# Patient Record
Sex: Female | Born: 1980 | Race: White | Hispanic: No | Marital: Married | Smoking: Never smoker
Health system: Southern US, Community
[De-identification: ages and names within clinical notes are randomized; demographics above are authoritative.]

## PROBLEM LIST (undated history)

## (undated) DIAGNOSIS — IMO0002 Reserved for concepts with insufficient information to code with codable children: Secondary | ICD-10-CM

## (undated) DIAGNOSIS — R87629 Unspecified abnormal cytological findings in specimens from vagina: Secondary | ICD-10-CM

## (undated) DIAGNOSIS — A609 Anogenital herpesviral infection, unspecified: Secondary | ICD-10-CM

---

## 2005-11-20 ENCOUNTER — Other Ambulatory Visit: Admission: RE | Admit: 2005-11-20 | Discharge: 2005-11-20 | Payer: Self-pay | Admitting: Gynecology

## 2006-11-20 ENCOUNTER — Other Ambulatory Visit: Admission: RE | Admit: 2006-11-20 | Discharge: 2006-11-20 | Payer: Self-pay | Admitting: Gynecology

## 2007-05-01 ENCOUNTER — Other Ambulatory Visit: Admission: RE | Admit: 2007-05-01 | Discharge: 2007-05-01 | Payer: Self-pay | Admitting: Gynecology

## 2007-09-02 ENCOUNTER — Other Ambulatory Visit: Admission: RE | Admit: 2007-09-02 | Discharge: 2007-09-02 | Payer: Self-pay | Admitting: Obstetrics and Gynecology

## 2007-11-18 ENCOUNTER — Encounter: Admission: RE | Admit: 2007-11-18 | Discharge: 2007-11-18 | Payer: Self-pay | Admitting: Gastroenterology

## 2008-04-20 IMAGING — US US ABDOMEN COMPLETE
1 series · 14 of 25 positions shown · non-contrast
Comparison: none

CLINICAL DATA: Abdominal pain. 
 COMPLETE ABDOMINAL ULTRASOUND:
TECHNIQUE: Complete abdominal ultrasound examination was performed including evaluation of the liver, gallbladder, bile ducts, pancreas, kidneys, spleen, IVC, and abdominal aorta.  Pancreatic region is limited for assessment due to overlying gastrointestinal gas.

[Series 1: unknown · 0.33mm/px · 14 of 66 slices shown]
[im 1/66]
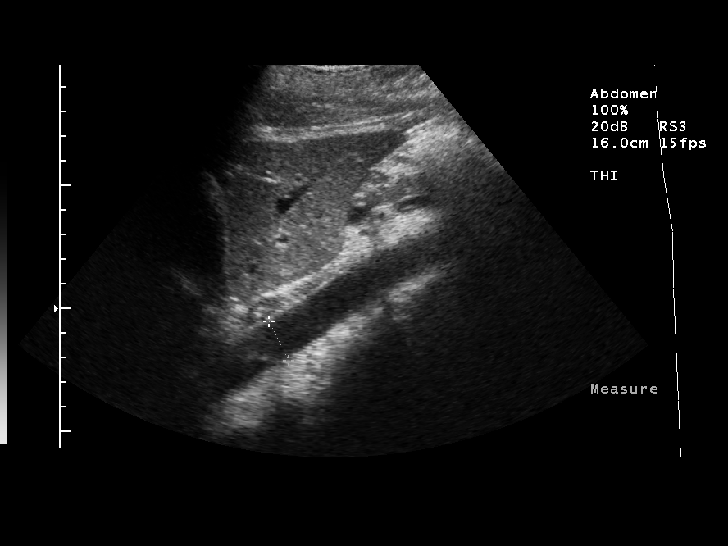
[im 6/66]
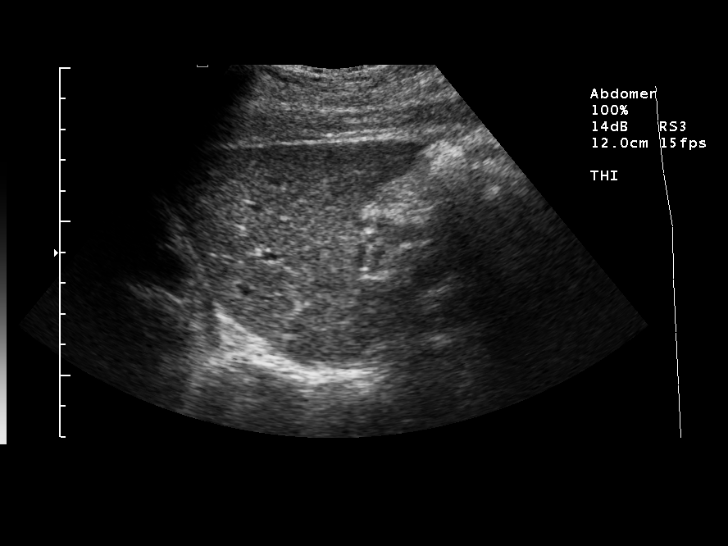
[im 11/66]
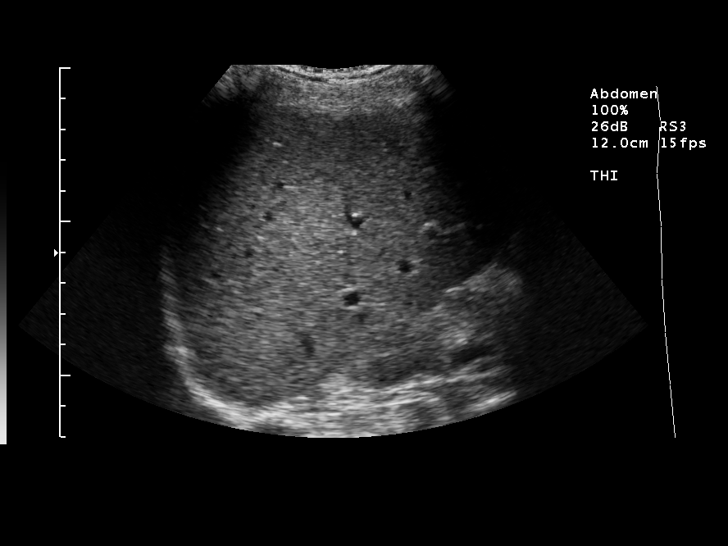
[im 17/66]
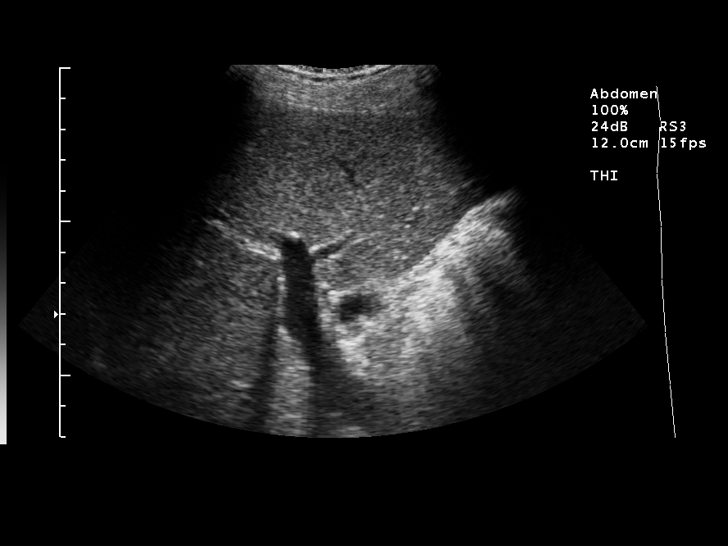
[im 22/66]
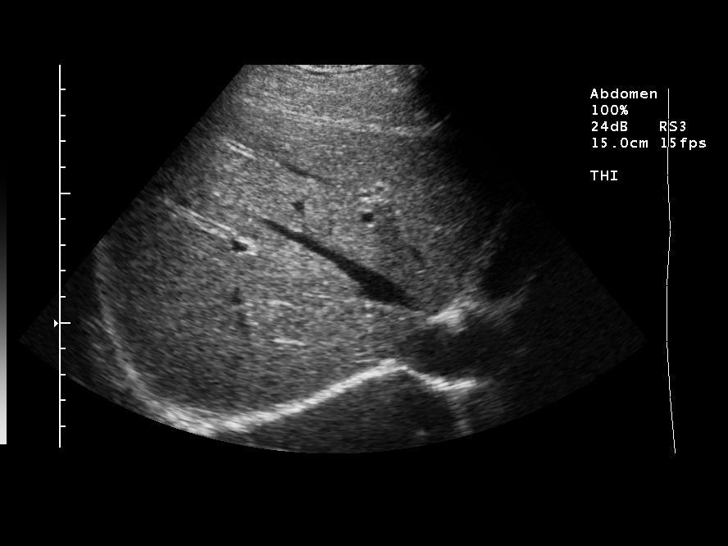
[im 25/66]
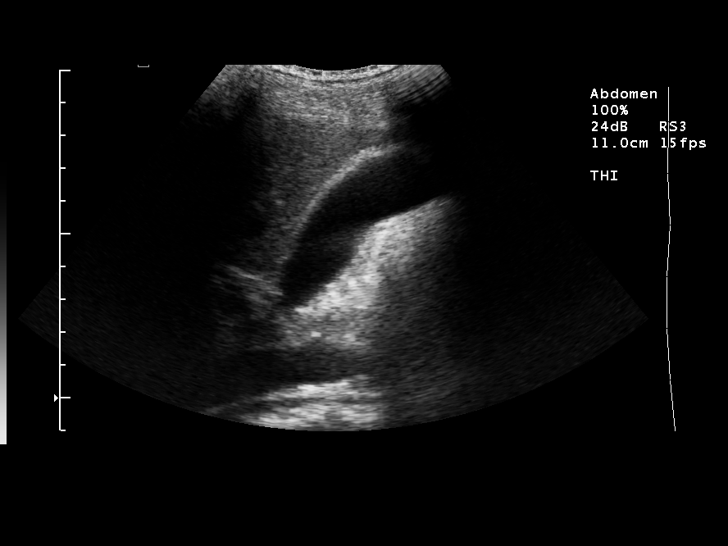
[im 30/66]
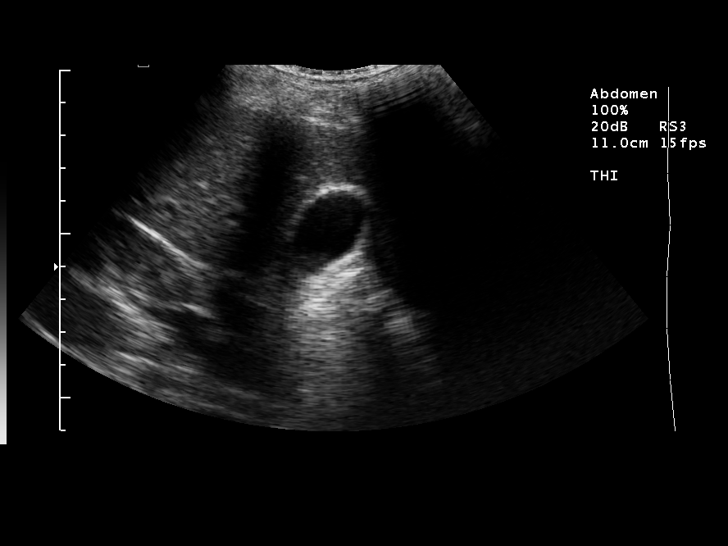
[im 36/66]
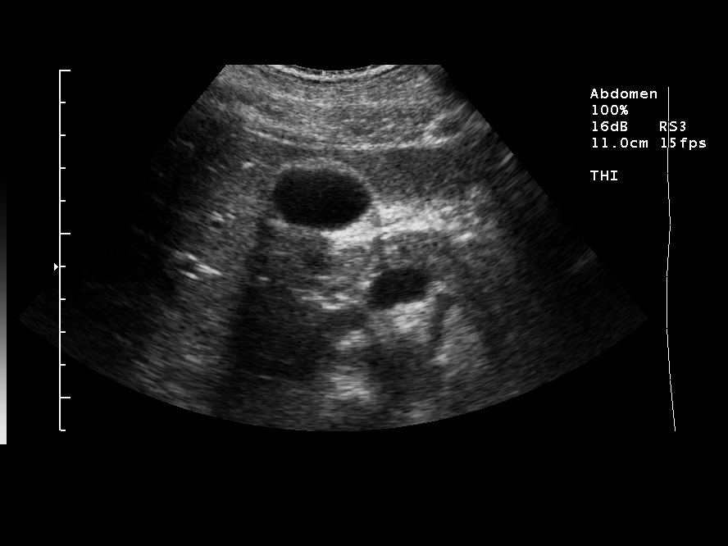
[im 41/66]
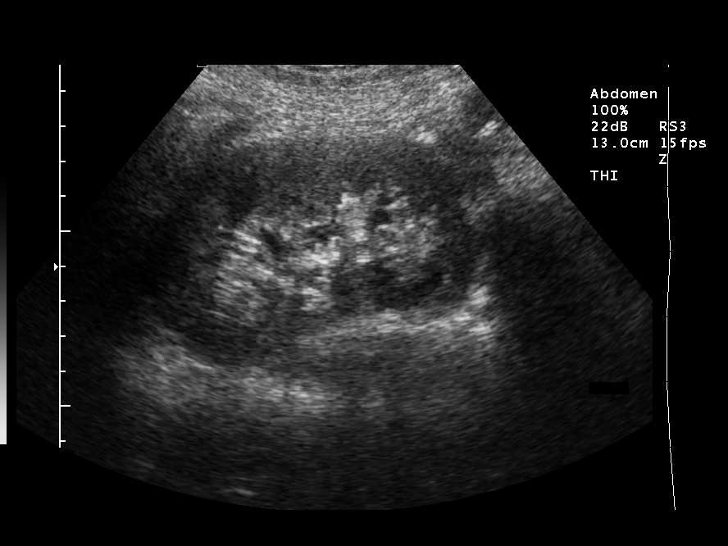
[im 44/66]
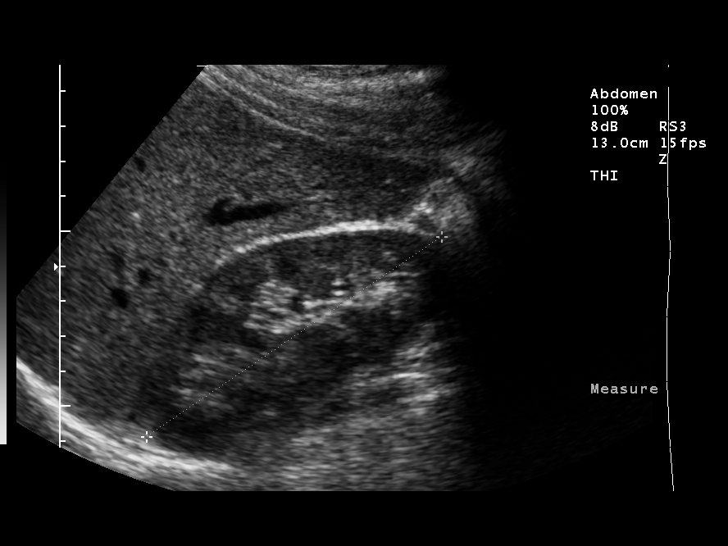
[im 49/66]
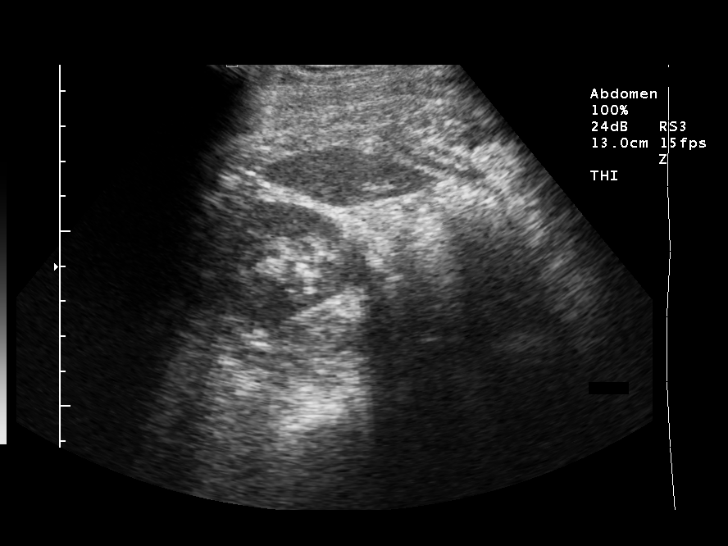
[im 55/66]
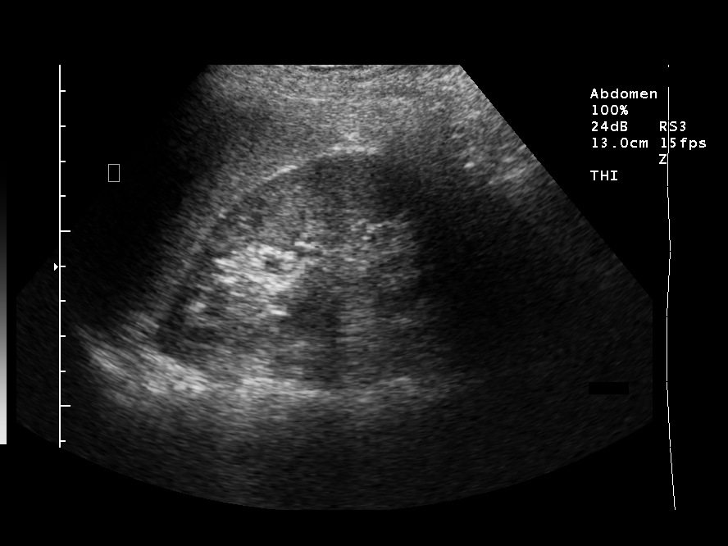
[im 60/66]
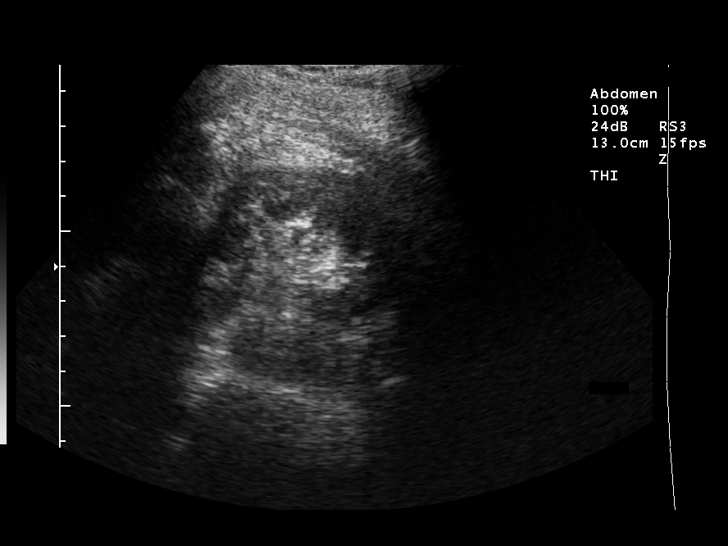
[im 66/66]
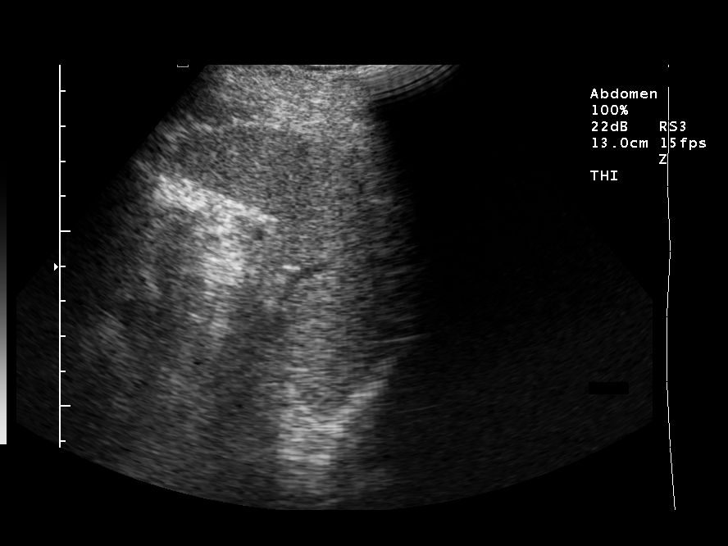

[14 of 25 positions shown; findings below may reference images not displayed]

FINDINGS: There is no evidence of gallstones or biliary ductal dilatation.  The liver is within normal limits in echogenicity, and no focal liver lesions are seen.  The visualized portions of the IVC and pancreas are unremarkable.
 There is no evidence of splenomegaly.  The kidneys are unremarkable, and there is no evidence of hydronephrosis.  The abdominal aorta is non-dilated.
 Gallbladder wall thickness 2mm, common bile duct diameter 2mm, spleen 8.4cm long, right kidney 10.1cm long, left kidney 10.5cm long, maximum abdominal aortic diameter 1.7cm.
IMPRESSION: 1.  Limited assessment of pancreas due to overlying gastrointestinal gas. 
 2.  Otherwise negative.

## 2008-08-02 ENCOUNTER — Other Ambulatory Visit: Admission: RE | Admit: 2008-08-02 | Discharge: 2008-08-02 | Payer: Self-pay | Admitting: Obstetrics and Gynecology

## 2008-09-13 ENCOUNTER — Other Ambulatory Visit: Admission: RE | Admit: 2008-09-13 | Discharge: 2008-09-13 | Payer: Self-pay | Admitting: Obstetrics and Gynecology

## 2010-01-18 ENCOUNTER — Other Ambulatory Visit: Admission: RE | Admit: 2010-01-18 | Discharge: 2010-01-18 | Payer: Self-pay | Admitting: Obstetrics and Gynecology

## 2011-01-24 ENCOUNTER — Other Ambulatory Visit (HOSPITAL_COMMUNITY)
Admission: RE | Admit: 2011-01-24 | Discharge: 2011-01-24 | Disposition: A | Discharge status: Home / Self Care | Payer: BC Managed Care – PPO | Source: Ambulatory Visit | Attending: Obstetrics and Gynecology | Admitting: Obstetrics and Gynecology

## 2011-01-24 ENCOUNTER — Other Ambulatory Visit: Payer: Self-pay | Admitting: Obstetrics and Gynecology

## 2011-01-24 DIAGNOSIS — Z113 Encounter for screening for infections with a predominantly sexual mode of transmission: Secondary | ICD-10-CM | POA: Insufficient documentation

## 2011-01-24 DIAGNOSIS — Z01419 Encounter for gynecological examination (general) (routine) without abnormal findings: Secondary | ICD-10-CM | POA: Insufficient documentation

## 2012-12-01 LAB — OB RESULTS CONSOLE GC/CHLAMYDIA
Chlamydia: NEGATIVE
Gonorrhea: NEGATIVE

## 2012-12-01 LAB — OB RESULTS CONSOLE HEPATITIS B SURFACE ANTIGEN: Hepatitis B Surface Ag: NEGATIVE

## 2012-12-01 LAB — OB RESULTS CONSOLE ABO/RH: RH Type: POSITIVE

## 2012-12-01 LAB — OB RESULTS CONSOLE RPR: RPR: NONREACTIVE

## 2013-07-13 ENCOUNTER — Encounter (HOSPITAL_COMMUNITY): Payer: Self-pay | Admitting: *Deleted

## 2013-07-13 ENCOUNTER — Encounter (HOSPITAL_COMMUNITY): Payer: Self-pay | Admitting: Anesthesiology

## 2013-07-13 ENCOUNTER — Inpatient Hospital Stay (HOSPITAL_COMMUNITY): Payer: BC Managed Care – PPO | Admitting: Anesthesiology

## 2013-07-13 ENCOUNTER — Inpatient Hospital Stay (HOSPITAL_COMMUNITY)
Admission: AD | Admit: 2013-07-13 | Discharge: 2013-07-16 | DRG: 371 | Disposition: A | Discharge status: Home / Self Care | Payer: BC Managed Care – PPO | Source: Ambulatory Visit | Attending: Obstetrics & Gynecology | Admitting: Obstetrics & Gynecology

## 2013-07-13 ENCOUNTER — Encounter (HOSPITAL_COMMUNITY)
Admission: AD | Disposition: A | Discharge status: Home / Self Care | Payer: Self-pay | Source: Ambulatory Visit | Attending: Obstetrics & Gynecology

## 2013-07-13 DIAGNOSIS — O480 Post-term pregnancy: Secondary | ICD-10-CM | POA: Diagnosis present

## 2013-07-13 DIAGNOSIS — O99892 Other specified diseases and conditions complicating childbirth: Secondary | ICD-10-CM | POA: Diagnosis present

## 2013-07-13 DIAGNOSIS — O3660X0 Maternal care for excessive fetal growth, unspecified trimester, not applicable or unspecified: Secondary | ICD-10-CM | POA: Diagnosis present

## 2013-07-13 DIAGNOSIS — O324XX0 Maternal care for high head at term, not applicable or unspecified: Secondary | ICD-10-CM | POA: Diagnosis present

## 2013-07-13 DIAGNOSIS — Z22330 Carrier of Group B streptococcus: Secondary | ICD-10-CM

## 2013-07-13 HISTORY — DX: Reserved for concepts with insufficient information to code with codable children: IMO0002

## 2013-07-13 LAB — CBC
HCT: 36.7 % (ref 36.0–46.0)
Hemoglobin: 13.2 g/dL (ref 12.0–15.0)
MCH: 31.4 pg (ref 26.0–34.0)
MCHC: 36 g/dL (ref 30.0–36.0)
MCV: 87.2 fL (ref 78.0–100.0)
RBC: 4.21 MIL/uL (ref 3.87–5.11)

## 2013-07-13 LAB — RPR: RPR Ser Ql: NONREACTIVE

## 2013-07-13 SURGERY — Surgical Case
Anesthesia: Epidural | Site: Abdomen | Wound class: Clean Contaminated

## 2013-07-13 MED ORDER — HYDROMORPHONE HCL PF 1 MG/ML IJ SOLN
INTRAMUSCULAR | Status: DC | PRN
  Administered 2013-07-13: 1 mg via INTRAVENOUS

## 2013-07-13 MED ORDER — BUPIVACAINE HCL (PF) 0.25 % IJ SOLN
INTRAMUSCULAR | Status: AC
  Filled 2013-07-13: qty 30

## 2013-07-13 MED ORDER — MORPHINE SULFATE (PF) 0.5 MG/ML IJ SOLN
INTRAMUSCULAR | Status: DC | PRN
  Administered 2013-07-13: 4 mg via EPIDURAL

## 2013-07-13 MED ORDER — BUPIVACAINE HCL (PF) 0.25 % IJ SOLN
INTRAMUSCULAR | Status: DC | PRN
  Administered 2013-07-13: 10 mL

## 2013-07-13 MED ORDER — LIDOCAINE HCL (PF) 1 % IJ SOLN
INTRAMUSCULAR | Status: DC | PRN
  Administered 2013-07-13 (×2): 4 mL

## 2013-07-13 MED ORDER — OXYTOCIN 40 UNITS IN LACTATED RINGERS INFUSION - SIMPLE MED
999.0000 mL/h | Freq: Once | INTRAVENOUS | Status: DC
  Filled 2013-07-13: qty 1000

## 2013-07-13 MED ORDER — DEXAMETHASONE SODIUM PHOSPHATE 10 MG/ML IJ SOLN
INTRAMUSCULAR | Status: DC | PRN
  Administered 2013-07-13: 10 mg via INTRAVENOUS

## 2013-07-13 MED ORDER — NALBUPHINE SYRINGE 5 MG/0.5 ML
5.0000 mg | INJECTION | INTRAMUSCULAR | Status: DC | PRN
  Administered 2013-07-14 (×2): 10 mg via INTRAVENOUS
  Filled 2013-07-13 (×3): qty 1

## 2013-07-13 MED ORDER — DEXAMETHASONE SODIUM PHOSPHATE 10 MG/ML IJ SOLN
INTRAMUSCULAR | Status: AC
  Filled 2013-07-13: qty 1

## 2013-07-13 MED ORDER — EPHEDRINE 5 MG/ML INJ
10.0000 mg | INTRAVENOUS | Status: DC | PRN
  Filled 2013-07-13: qty 4

## 2013-07-13 MED ORDER — KETOROLAC TROMETHAMINE 30 MG/ML IJ SOLN: 30.0000 mg | Freq: Four times a day (QID) | INTRAMUSCULAR | Status: AC | PRN

## 2013-07-13 MED ORDER — LIDOCAINE HCL (PF) 1 % IJ SOLN
30.0000 mL | INTRAMUSCULAR | Status: DC | PRN
  Filled 2013-07-13: qty 30

## 2013-07-13 MED ORDER — ONDANSETRON HCL 4 MG/2ML IJ SOLN
4.0000 mg | Freq: Four times a day (QID) | INTRAMUSCULAR | Status: DC | PRN
  Administered 2013-07-13: 4 mg via INTRAVENOUS
  Filled 2013-07-13: qty 2

## 2013-07-13 MED ORDER — KETOROLAC TROMETHAMINE 60 MG/2ML IM SOLN: 60.0000 mg | Freq: Once | INTRAMUSCULAR | Status: DC | PRN

## 2013-07-13 MED ORDER — NALBUPHINE SYRINGE 5 MG/0.5 ML
5.0000 mg | INJECTION | INTRAMUSCULAR | Status: DC | PRN
  Filled 2013-07-13: qty 1

## 2013-07-13 MED ORDER — SODIUM CHLORIDE 0.9 % IJ SOLN: 3.0000 mL | INTRAMUSCULAR | Status: DC | PRN

## 2013-07-13 MED ORDER — DIPHENHYDRAMINE HCL 50 MG/ML IJ SOLN: 12.5000 mg | INTRAMUSCULAR | Status: DC | PRN

## 2013-07-13 MED ORDER — MEPERIDINE HCL 25 MG/ML IJ SOLN: 6.2500 mg | INTRAMUSCULAR | Status: DC | PRN

## 2013-07-13 MED ORDER — FENTANYL CITRATE 0.05 MG/ML IJ SOLN
100.0000 ug | Freq: Once | INTRAMUSCULAR | Status: AC
  Administered 2013-07-13: 100 ug via EPIDURAL

## 2013-07-13 MED ORDER — BUPIVACAINE HCL (PF) 0.25 % IJ SOLN
INTRAMUSCULAR | Status: DC | PRN
  Administered 2013-07-13: 2.5 mL via EPIDURAL
  Administered 2013-07-13: 5 mL via EPIDURAL
  Administered 2013-07-13 (×2): 5 mL
  Administered 2013-07-13: 2.5 mL via EPIDURAL

## 2013-07-13 MED ORDER — NALOXONE HCL 0.4 MG/ML IJ SOLN: 0.4000 mg | INTRAMUSCULAR | Status: DC | PRN

## 2013-07-13 MED ORDER — SODIUM CHLORIDE 0.9 % IV SOLN
2.0000 g | Freq: Four times a day (QID) | INTRAVENOUS | Status: DC
  Administered 2013-07-13 (×2): 2 g via INTRAVENOUS
  Filled 2013-07-13 (×4): qty 2000

## 2013-07-13 MED ORDER — SCOPOLAMINE 1 MG/3DAYS TD PT72
MEDICATED_PATCH | TRANSDERMAL | Status: AC
  Administered 2013-07-13: 1.5 mg via TRANSDERMAL
  Filled 2013-07-13: qty 1

## 2013-07-13 MED ORDER — LACTATED RINGERS IV SOLN
500.0000 mL | Freq: Once | INTRAVENOUS | Status: AC
  Administered 2013-07-13: 500 mL via INTRAVENOUS

## 2013-07-13 MED ORDER — KETOROLAC TROMETHAMINE 30 MG/ML IJ SOLN
30.0000 mg | Freq: Four times a day (QID) | INTRAMUSCULAR | Status: AC | PRN
  Administered 2013-07-13 – 2013-07-14 (×2): 30 mg via INTRAVENOUS
  Filled 2013-07-13: qty 1

## 2013-07-13 MED ORDER — IBUPROFEN 600 MG PO TABS: 600.0000 mg | ORAL_TABLET | Freq: Four times a day (QID) | ORAL | Status: DC | PRN

## 2013-07-13 MED ORDER — FENTANYL CITRATE 0.05 MG/ML IJ SOLN
25.0000 ug | INTRAMUSCULAR | Status: DC | PRN
  Administered 2013-07-13 – 2013-07-14 (×2): 50 ug via INTRAVENOUS

## 2013-07-13 MED ORDER — PHENYLEPHRINE 40 MCG/ML (10ML) SYRINGE FOR IV PUSH (FOR BLOOD PRESSURE SUPPORT)
80.0000 ug | PREFILLED_SYRINGE | INTRAVENOUS | Status: DC | PRN
  Filled 2013-07-13: qty 5

## 2013-07-13 MED ORDER — FENTANYL CITRATE 0.05 MG/ML IJ SOLN
INTRAMUSCULAR | Status: AC
Start: 2013-07-13 — End: 2013-07-14
  Administered 2013-07-14: 50 ug via INTRAVENOUS
  Filled 2013-07-13: qty 2

## 2013-07-13 MED ORDER — FENTANYL CITRATE 0.05 MG/ML IJ SOLN
INTRAMUSCULAR | Status: AC
  Filled 2013-07-13: qty 2

## 2013-07-13 MED ORDER — OXYTOCIN 10 UNIT/ML IJ SOLN
INTRAMUSCULAR | Status: AC
  Filled 2013-07-13: qty 4

## 2013-07-13 MED ORDER — FENTANYL 2.5 MCG/ML BUPIVACAINE 1/10 % EPIDURAL INFUSION (WH - ANES)
14.0000 mL/h | INTRAMUSCULAR | Status: DC | PRN
  Administered 2013-07-13: 14 mL/h via EPIDURAL
  Filled 2013-07-13 (×2): qty 125

## 2013-07-13 MED ORDER — PHENYLEPHRINE 40 MCG/ML (10ML) SYRINGE FOR IV PUSH (FOR BLOOD PRESSURE SUPPORT): 80.0000 ug | PREFILLED_SYRINGE | INTRAVENOUS | Status: DC | PRN

## 2013-07-13 MED ORDER — MEPERIDINE HCL 25 MG/ML IJ SOLN
INTRAMUSCULAR | Status: AC
  Filled 2013-07-13: qty 1

## 2013-07-13 MED ORDER — KETOROLAC TROMETHAMINE 30 MG/ML IJ SOLN
INTRAMUSCULAR | Status: AC
  Administered 2013-07-13: 30 mg via INTRAVENOUS
  Filled 2013-07-13: qty 1

## 2013-07-13 MED ORDER — SODIUM CHLORIDE 0.9 % IV SOLN
2.0000 g | Freq: Once | INTRAVENOUS | Status: AC
  Administered 2013-07-13: 2 g via INTRAVENOUS
  Filled 2013-07-13: qty 2000

## 2013-07-13 MED ORDER — NALOXONE HCL 1 MG/ML IJ SOLN
1.0000 ug/kg/h | INTRAVENOUS | Status: DC | PRN
  Filled 2013-07-13: qty 2

## 2013-07-13 MED ORDER — OXYTOCIN 10 UNIT/ML IJ SOLN
40.0000 [IU] | INTRAVENOUS | Status: DC | PRN
  Administered 2013-07-13: 40 [IU] via INTRAVENOUS

## 2013-07-13 MED ORDER — BUPIVACAINE HCL (PF) 0.5 % IJ SOLN
INTRAMUSCULAR | Status: DC | PRN
  Administered 2013-07-13 (×2): 2.5 mL via EPIDURAL

## 2013-07-13 MED ORDER — DIPHENHYDRAMINE HCL 50 MG/ML IJ SOLN: 25.0000 mg | INTRAMUSCULAR | Status: DC | PRN

## 2013-07-13 MED ORDER — METOCLOPRAMIDE HCL 5 MG/ML IJ SOLN: 10.0000 mg | Freq: Three times a day (TID) | INTRAMUSCULAR | Status: DC | PRN

## 2013-07-13 MED ORDER — SCOPOLAMINE 1 MG/3DAYS TD PT72
1.0000 | MEDICATED_PATCH | Freq: Once | TRANSDERMAL | Status: DC
  Administered 2013-07-13: 1.5 mg via TRANSDERMAL

## 2013-07-13 MED ORDER — ONDANSETRON HCL 4 MG/2ML IJ SOLN
INTRAMUSCULAR | Status: AC
  Filled 2013-07-13: qty 2

## 2013-07-13 MED ORDER — MEPERIDINE HCL 25 MG/ML IJ SOLN
INTRAMUSCULAR | Status: DC | PRN
  Administered 2013-07-13 (×2): 12.5 mg via INTRAVENOUS

## 2013-07-13 MED ORDER — ONDANSETRON HCL 4 MG/2ML IJ SOLN
INTRAMUSCULAR | Status: DC | PRN
  Administered 2013-07-13: 4 mg via INTRAVENOUS

## 2013-07-13 MED ORDER — LIDOCAINE-EPINEPHRINE (PF) 2 %-1:200000 IJ SOLN
INTRAMUSCULAR | Status: AC
  Filled 2013-07-13: qty 20

## 2013-07-13 MED ORDER — ONDANSETRON HCL 4 MG/2ML IJ SOLN: 4.0000 mg | Freq: Three times a day (TID) | INTRAMUSCULAR | Status: DC | PRN

## 2013-07-13 MED ORDER — MORPHINE SULFATE 0.5 MG/ML IJ SOLN
INTRAMUSCULAR | Status: AC
  Filled 2013-07-13: qty 10

## 2013-07-13 MED ORDER — CITRIC ACID-SODIUM CITRATE 334-500 MG/5ML PO SOLN
30.0000 mL | ORAL | Status: DC | PRN
  Administered 2013-07-13: 30 mL via ORAL
  Filled 2013-07-13: qty 15

## 2013-07-13 MED ORDER — OXYTOCIN 40 UNITS IN LACTATED RINGERS INFUSION - SIMPLE MED: 62.5000 mL/h | INTRAVENOUS | Status: DC

## 2013-07-13 MED ORDER — OXYTOCIN 10 UNIT/ML IJ SOLN: 10.0000 [IU] | Freq: Once | INTRAMUSCULAR | Status: DC

## 2013-07-13 MED ORDER — FENTANYL CITRATE 0.05 MG/ML IJ SOLN
INTRAMUSCULAR | Status: AC
  Administered 2013-07-13: 50 ug via INTRAVENOUS
  Filled 2013-07-13: qty 2

## 2013-07-13 MED ORDER — FENTANYL CITRATE 0.05 MG/ML IJ SOLN
INTRAMUSCULAR | Status: DC | PRN
  Administered 2013-07-13: 100 ug via EPIDURAL

## 2013-07-13 MED ORDER — FENTANYL 2.5 MCG/ML BUPIVACAINE 1/10 % EPIDURAL INFUSION (WH - ANES)
INTRAMUSCULAR | Status: DC | PRN
  Administered 2013-07-13: 14 mL/h via EPIDURAL

## 2013-07-13 MED ORDER — SODIUM BICARBONATE 8.4 % IV SOLN
INTRAVENOUS | Status: AC
  Filled 2013-07-13: qty 50

## 2013-07-13 MED ORDER — SODIUM BICARBONATE 8.4 % IV SOLN
INTRAVENOUS | Status: DC | PRN
  Administered 2013-07-13 (×4): 5 mL via EPIDURAL

## 2013-07-13 MED ORDER — LACTATED RINGERS IV SOLN
INTRAVENOUS | Status: DC | PRN
  Administered 2013-07-13 (×3): via INTRAVENOUS

## 2013-07-13 MED ORDER — LACTATED RINGERS IV SOLN: 500.0000 mL | INTRAVENOUS | Status: DC | PRN

## 2013-07-13 MED ORDER — DIPHENHYDRAMINE HCL 25 MG PO CAPS
25.0000 mg | ORAL_CAPSULE | ORAL | Status: DC | PRN
  Filled 2013-07-13: qty 1

## 2013-07-13 SURGICAL SUPPLY — 28 items
CLAMP CORD UMBIL (MISCELLANEOUS) IMPLANT
CLOTH BEACON ORANGE TIMEOUT ST (SAFETY) ×2 IMPLANT
CONTAINER PREFILL 10% NBF 15ML (MISCELLANEOUS) IMPLANT
DRAPE LG THREE QUARTER DISP (DRAPES) ×2 IMPLANT
DRSG OPSITE POSTOP 4X10 (GAUZE/BANDAGES/DRESSINGS) ×2 IMPLANT
DURAPREP 26ML APPLICATOR (WOUND CARE) ×2 IMPLANT
ELECT REM PT RETURN 9FT ADLT (ELECTROSURGICAL) ×2
ELECTRODE REM PT RTRN 9FT ADLT (ELECTROSURGICAL) ×1 IMPLANT
EXTRACTOR VACUUM M CUP 4 TUBE (SUCTIONS) IMPLANT
GLOVE BIO SURGEON STRL SZ7.5 (GLOVE) ×2 IMPLANT
GOWN PREVENTION PLUS XLARGE (GOWN DISPOSABLE) ×2 IMPLANT
GOWN STRL REIN XL XLG (GOWN DISPOSABLE) ×4 IMPLANT
KIT ABG SYR 3ML LUER SLIP (SYRINGE) IMPLANT
NEEDLE HYPO 25X1 1.5 SAFETY (NEEDLE) ×2 IMPLANT
NEEDLE HYPO 25X5/8 SAFETYGLIDE (NEEDLE) IMPLANT
NS IRRIG 1000ML POUR BTL (IV SOLUTION) ×2 IMPLANT
PACK C SECTION WH (CUSTOM PROCEDURE TRAY) ×2 IMPLANT
STAPLER VISISTAT 35W (STAPLE) ×2 IMPLANT
SUT MNCRL 0 VIOLET CTX 36 (SUTURE) ×2 IMPLANT
SUT MON AB 2-0 CT1 27 (SUTURE) ×2 IMPLANT
SUT MON AB-0 CT1 36 (SUTURE) ×4 IMPLANT
SUT MONOCRYL 0 CTX 36 (SUTURE) ×2
SUT PLAIN 0 NONE (SUTURE) IMPLANT
SUT PLAIN 2 0 XLH (SUTURE) IMPLANT
SYR CONTROL 10ML LL (SYRINGE) ×2 IMPLANT
TOWEL OR 17X24 6PK STRL BLUE (TOWEL DISPOSABLE) ×2 IMPLANT
TRAY FOLEY CATH 14FR (SET/KITS/TRAYS/PACK) ×2 IMPLANT
WATER STERILE IRR 1000ML POUR (IV SOLUTION) ×2 IMPLANT

## 2013-07-13 NOTE — Transfer of Care (Signed)
Immediate Anesthesia Transfer of Care Note  Patient: Ariana Brown  Procedure(s) Performed: Procedure(s): Primary CESAREAN SECTION  of baby girl at 2256 APGAR 7/9 (N/A)  Patient Location: PACU  Anesthesia Type:Epidural  Level of Consciousness: awake  Airway & Oxygen Therapy: Patient Spontanous Breathing  Post-op Assessment: Report given to PACU RN and Post -op Vital signs reviewed and stable  Post vital signs: stable  Complications: No apparent anesthesia complications

## 2013-07-13 NOTE — Progress Notes (Signed)
Subjective:  Uncomfortable, feeling intense rectal pressure and shoulder/neck discomfort  Objective:   VS: Blood pressure 99/62, pulse 85, temperature 100.2 F (37.9 C), temperature source Axillary, resp. rate 18, height 5' 6.75" (1.695 m), weight 92.987 kg (205 lb). FHR : baseline 150 / variability moderate / accelerations + / variable decelerations Toco: contractions every 2-3 minutes  Cervix : 10/+1 Membranes: ruptured clear  Assessment:  Second stage labor FHR category II Maternal fever +GBS  Plan:  Minimal decent with pushing. Will dose epidural and labor down.  Dr. Billy Coast updated with plan of care, MD to assess     Ariana Brown, N MSN, CNM 07/13/2013, 10:03 PM

## 2013-07-13 NOTE — OR Nursing (Signed)
50 ml blood loss during fundal massage by DLWegner RN, cord blood x 2 to OR front desk 

## 2013-07-13 NOTE — Progress Notes (Signed)
Subjective:  Lying in bed, becoming more uncomfortable, ctx more frequent, having back pain, declines water immersion at this time  Objective:   VS: Blood pressure 85/52, pulse 87, temperature 98.1 F (36.7 C), temperature source Oral, resp. rate 18, height 5' 6.75" (1.695 m), weight 92.987 kg (205 lb). FHR : baseline 140 / variability moderate / accelerations + / no decelerations Toco: contractions every 3-4 minutes  Cervix : 6/100/-1 Membranes: intact  Assessment:  First stage labor-active GBS + FHR category I  Plan:  Expectant management, position changes and water immersion if desires, continue abx     Donette Larry, N MSN, CNM 07/13/2013, 1:19 PM

## 2013-07-13 NOTE — Anesthesia Procedure Notes (Addendum)
Epidural Patient location during procedure: OB Start time: 07/13/2013 3:00 PM  Staffing Anesthesiologist: Tapanga Ottaway A. Performed by: anesthesiologist   Preanesthetic Checklist Completed: patient identified, site marked, surgical consent, pre-op evaluation, timeout performed, IV checked, risks and benefits discussed and monitors and equipment checked  Epidural Patient position: sitting Prep: site prepped and draped and DuraPrep Patient monitoring: continuous pulse ox and blood pressure Approach: midline Injection technique: LOR air  Needle:  Needle type: Tuohy  Needle gauge: 17 G Needle length: 9 cm and 9 Needle insertion depth: 6 cm Catheter type: closed end flexible Catheter size: 19 Gauge Catheter at skin depth: 11 cm Test dose: negative and Other  Assessment Events: blood not aspirated, injection not painful, no injection resistance, negative IV test and no paresthesia  Additional Notes Patient identified. Risks and benefits discussed including failed block, incomplete  Pain control, post dural puncture headache, nerve damage, paralysis, blood pressure Changes, nausea, vomiting, reactions to medications-both toxic and allergic and post Partum back pain. All questions were answered. Patient expressed understanding and wished to proceed. Sterile technique was used throughout procedure. Epidural site was Dressed with sterile barrier dressing. No paresthesias, signs of intravascular injection Or signs of intrathecal spread were encountered.  Patient was more comfortable after the epidural was dosed. Please see RN's note for documentation of vital signs and FHR which are stable.   Epidural Patient location during procedure: OB Start time: 07/13/2013 5:20 PM  Staffing Anesthesiologist: Neah Sporrer A. Performed by: anesthesiologist   Preanesthetic Checklist Completed: patient identified, site marked, surgical consent, pre-op evaluation, timeout performed, IV  checked, risks and benefits discussed and monitors and equipment checked  Epidural Patient position: sitting Prep: site prepped and draped and DuraPrep Patient monitoring: continuous pulse ox and blood pressure Approach: midline Injection technique: LOR air  Needle:  Needle type: Tuohy  Needle gauge: 17 G Needle length: 9 cm and 9 Needle insertion depth: 5 cm cm Catheter type: closed end flexible Catheter size: 19 Gauge Catheter at skin depth: 10 cm Test dose: negative  Assessment Events: blood not aspirated, injection not painful, no injection resistance, negative IV test and no paresthesia  Additional Notes Patient identified. Risks and benefits discussed including failed block, incomplete  Pain control, post dural puncture headache, nerve damage, paralysis, blood pressure Changes, nausea, vomiting, reactions to medications-both toxic and allergic and post Partum back pain. All questions were answered. Patient expressed understanding and wished to proceed. Sterile technique was used throughout procedure. Epidural site was Dressed with sterile barrier dressing. No paresthesias, signs of intravascular injection Or signs of intrathecal spread were encountered.  Patient was more comfortable after the epidural was dosed. Please see RN's note for documentation of vital signs and FHR which are stable.

## 2013-07-13 NOTE — H&P (Signed)
OB ADMISSION/ HISTORY & PHYSICAL:  Admission Date: 07/13/2013  8:11 AM  Admit Diagnosis: 40.[redacted] wks gestation, active labor  Ariana Brown is a 32 y.o. female presenting for ctx starting at 8pm last night, occurring more frequently and now q 5 min since 0630. Planning water-birth.  Prenatal History: G1P0   EDC : 07/09/2013, by Karilyn Cota Date of Conception  Prenatal care at [facility]  Primary Ob Provider: Dr Billy Coast Prenatal course complicated by +GBS  Prenatal Labs: ABO, Rh: A (01/20 0000) POS Antibody: Negative (01/20 0000) Rubella: Immune (01/20 0000)  RPR: Nonreactive (01/20 0000)  HBsAg:   Neg HIV: Non-reactive (01/20 0000)  GBS: Positive (06/10 0000)  1 hr GTT : WNL  Medical / Surgical History :  Past medical history:  Past Medical History  Diagnosis Date  . Abnormal Pap smear      Past surgical history: History reviewed. No pertinent past surgical history.  Family History: History reviewed. No pertinent family history.   Social History:  reports that she has never smoked. She has never used smokeless tobacco. She reports that she does not drink alcohol or use illicit drugs.  Allergies: Hydrocodone   Current Medications at time of admission:  Prior to Admission medications   Medication Sig Start Date End Date Taking? Authorizing Provider  Prenatal Vit-Fe Fumarate-FA (PRENATAL MULTIVITAMIN) TABS tablet Take 1 tablet by mouth daily at 12 noon.   Yes Historical Provider, MD     Review of Systems: +ctx, no VB or LOF, +FM, no fever or chills   Physical Exam:  VS: Blood pressure 123/74, pulse 76, temperature 98.1 F (36.7 C), temperature source Oral, resp. rate 18, height 5' 6.75" (1.695 m), weight 92.987 kg (205 lb).  General: alert and oriented, appears uncomfortable w/ctx Heart: RRR Lungs: Clear lung fields Abdomen: Gravid, soft and non-tender, non-distended / uterus: EFW 7.5lbs Extremities: no edema  Genitalia / VE: Dilation: 5 Effacement (%):  90 Station: -2 Exam by:: Melanie, CNM Vtx  FHR: baseline rate 140 / variability moderate / accelerations present / no decelerations TOCO: q2-3  Assessment: 40.[redacted] weeks gestation First stage of labor FHR category I GBS carrier   Plan:  Admit, expectant management, GBS prophylaxis, water immersion for pain  Dr Seymour Bars notified of admission / plan of care   Donette Larry, Dorris Carnes MSN, CNM 07/13/2013, 11:45 AM

## 2013-07-13 NOTE — Progress Notes (Signed)
Subjective:  Comfortable with epidural, a little discomfort on left lower side  Objective:   VS: Blood pressure 115/70, pulse 67, temperature 98.2 F (36.8 C), temperature source Oral, resp. rate 18, height 5' 6.75" (1.695 m), weight 92.987 kg (205 lb). FHR : baseline 135 / variability moderate / accelerations + / no decelerations Toco: contractions every 3-5 minutes  Cervix : 6.5/100/-1 Membranes: AROM-clear, small  Assessment:  First stage labor, active, protracted FHR category I GBS+  Plan:  Active management, AROM augmentation, continue abx, epidural analgesia     Donette Larry, N MSN, CNM 07/13/2013, 3:48 PM

## 2013-07-13 NOTE — Progress Notes (Signed)
Ariana Brown Patient is a 32 y.o. G1P0 at [redacted]w[redacted]d by LMP admitted for active labor  Subjective: Pushing x 2 hrs with no descent  Objective: BP 99/62  Pulse 85  Temp(Src) 100.2 F (37.9 C) (Axillary)  Resp 18  Ht 5' 6.75" (1.695 m)  Wt 92.987 kg (205 lb)  BMI 32.37 kg/m2      FHT:  FHR: 155 bpm, variability: moderate,  accelerations:  Present,  decelerations:  Absent UC:   regular, every 2 minutes SVE:   Dilation: 10 Effacement (%): 100 Station: +1 Exam by:: M. Denyse Amass, cnm No change  Labs: Lab Results  Component Value Date   WBC 12.1* 07/13/2013   HGB 13.2 07/13/2013   HCT 36.7 07/13/2013   MCV 87.2 07/13/2013   PLT 257 07/13/2013    Assessment / Plan: Arrest of decent  Labor: above Preeclampsia:  labs stable Fetal Wellbeing:  Category I Pain Control:  Epidural I/D:  n/a Anticipated MOD:  proceed with cesarean section. consent done.   Romey Mathieson J 07/13/2013, 10:17 PM

## 2013-07-13 NOTE — Op Note (Signed)
Cesarean Section Procedure Note  Indications: failure to progress: arrest of descent  Pre-operative Diagnosis: 40 week 4 day pregnancy.  Post-operative Diagnosis: same  Surgeon: Lenoard Aden   Assistants: Juliene Pina, MD  Anesthesia: Epidural anesthesia and Local anesthesia 0.25.% bupivacaine  ASA Class: 2  Procedure Details  The patient was seen in the Holding Room. The risks, benefits, complications, treatment options, and expected outcomes were discussed with the patient.  The patient concurred with the proposed plan, giving informed consent. The risks of anesthesia, infection, bleeding and possible injury to other organs discussed. Injury to bowel, bladder, or ureter with possible need for repair discussed. Possible need for transfusion with secondary risks of hepatitis or HIV acquisition discussed. Post operative complications to include but not limited to DVT, PE and Pneumonia noted. The site of surgery properly noted/marked. The patient was taken to Operating Room # 9, identified as Samera Krukowski and the procedure verified as C-Section Delivery. A Time Out was held and the above information confirmed.  After induction of anesthesia, the patient was draped and prepped in the usual sterile manner. A Pfannenstiel incision was made and carried down through the subcutaneous tissue to the fascia. Fascial incision was made and extended transversely using Mayo scissors. The fascia was separated from the underlying rectus tissue superiorly and inferiorly. The peritoneum was identified and entered. Peritoneal incision was extended longitudinally. The utero-vesical peritoneal reflection was incised transversely and the bladder flap was bluntly freed from the lower uterine segment. A low transverse uterine incision(Kerr hysterotomy) was made. Delivered from OT presentation was a  female with Apgar scores of 9 at one minute and 9 at five minutes. Bulb suctioning gently performed. Neonatal team in  attendance.After the umbilical cord was clamped and cut cord blood was obtained for evaluation. The placenta was removed intact and appeared normal. The uterus was curetted with a dry lap pack. Good hemostasis was noted.The uterine outline, tubes and ovaries appeared normal. The uterine incision was closed with running locked sutures of 0 Monocryl x 2 layers. Hemostasis was observed. Lavage was carried out until clear.The parietal peritoneum was closed with a running 2-0 Monocryl suture. The fascia was then reapproximated with running sutures of 0 Monocryl. 2-0 plain to close [place] layer. The skin was reapproximated with staples.  Instrument, sponge, and needle counts were correct prior the abdominal closure and at the conclusion of the case.   Findings: As noted, nl posterior placenta, nl tubes and ovaries, asynclitic OT presentation  Estimated Blood Loss:  500         Drains: foley                 Specimens: placenta                 Complications:  None; patient tolerated the procedure well.         Disposition: PACU - hemodynamically stable.         Condition: stable  Attending Attestation: I performed the procedure.

## 2013-07-13 NOTE — Progress Notes (Signed)
Subjective:  Feeling rectal pressure. Comfortable with epidural.  Objective:   VS: Blood pressure 97/51, pulse 66, temperature 98.6 F (37 C), temperature source Oral, resp. rate 18, height 5' 6.75" (1.695 m), weight 92.987 kg (205 lb). FHR : baseline 120 / variability moderate / accelerations + / variable decelerations Toco: contractions every 2-3 minutes, moderate Cervix : 9.5/100/0-+1 Membranes: ruptured-clear  Assessment:  Active first stage labor FHR category II +GBS  Plan:  Minimal fetal decent. Repositioned in exaggerated sims.       Donette Larry, N MSN, CNM 07/13/2013, 6:59 PM

## 2013-07-13 NOTE — Progress Notes (Signed)
Subjective:  Breathing with ctx, was in tub but now out, immersion decreased pain. No change in intensity or frequency of ctx.  Objective:   VS: Blood pressure 123/74, pulse 76, temperature 98.1 F (36.7 C), temperature source Oral, resp. rate 18, height 5' 6.75" (1.695 m), weight 92.987 kg (205 lb). FHR : baseline 140 / variability moderate / accelerations present / no decelerations Toco: contractions every 3-4 minutes, moderate Cervix : deferred Membranes: intact  Assessment:  40.[redacted] wks gestation First stage labor FHR category I +GBS  Plan:  Expectant management, encouraged ambulation and position changes for promotion of labor.      Donette Larry, N MSN, CNM 07/13/2013, 12:07 PM

## 2013-07-13 NOTE — Progress Notes (Signed)
Subjective:  Comfortable after epidural replacement, some nausea and vomiting  Objective:   VS: Blood pressure 96/64, pulse 77, temperature 98.6 F (37 C), temperature source Oral, resp. rate 18, height 5' 6.75" (1.695 m), weight 92.987 kg (205 lb). FHR : baseline 130 / variability moderate / accelerations + / no decelerations Toco: contractions every 1-5 minutes Cervix : 8/100/0 Membranes: rupt/clear  Assessment:  First stage labor, active FHR category I +GBS  Plan:  Labor progressing well, anticipate SVD Continue epidural Continue abx Zofran prn    Ariana Brown, N MSN, CNM 07/13/2013, 5:47 PM

## 2013-07-13 NOTE — Anesthesia Preprocedure Evaluation (Addendum)
Anesthesia Evaluation  Patient identified by MRN, date of birth, ID band Patient awake    Reviewed: Allergy & Precautions, H&P , Patient's Chart, lab work & pertinent test results  Airway Mallampati: II TM Distance: >3 FB Neck ROM: full    Dental no notable dental hx. (+) Teeth Intact   Pulmonary neg pulmonary ROS,  breath sounds clear to auscultation  Pulmonary exam normal       Cardiovascular negative cardio ROS  Rhythm:regular Rate:Normal     Neuro/Psych negative neurological ROS  negative psych ROS   GI/Hepatic negative GI ROS, Neg liver ROS,   Endo/Other  negative endocrine ROSObesity  Renal/GU negative Renal ROS  negative genitourinary   Musculoskeletal   Abdominal Normal abdominal exam  (+)   Peds  Hematology negative hematology ROS (+)   Anesthesia Other Findings   Reproductive/Obstetrics (+) Pregnancy (arrest of descent --> C/S)                          Anesthesia Physical Anesthesia Plan  ASA: II and emergent  Anesthesia Plan: Epidural   Post-op Pain Management:    Induction:   Airway Management Planned:   Additional Equipment:   Intra-op Plan:   Post-operative Plan:   Informed Consent: I have reviewed the patients History and Physical, chart, labs and discussed the procedure including the risks, benefits and alternatives for the proposed anesthesia with the patient or authorized representative who has indicated his/her understanding and acceptance.     Plan Discussed with: Anesthesiologist, Surgeon and CRNA  Anesthesia Plan Comments:        Anesthesia Quick Evaluation

## 2013-07-14 ENCOUNTER — Encounter (HOSPITAL_COMMUNITY): Payer: Self-pay

## 2013-07-14 LAB — CBC
Hemoglobin: 12.2 g/dL (ref 12.0–15.0)
RBC: 4.01 MIL/uL (ref 3.87–5.11)
WBC: 22.9 10*3/uL — ABNORMAL HIGH (ref 4.0–10.5)

## 2013-07-14 MED ORDER — SENNOSIDES-DOCUSATE SODIUM 8.6-50 MG PO TABS
2.0000 | ORAL_TABLET | Freq: Every day | ORAL | Status: DC
  Administered 2013-07-14 – 2013-07-15 (×2): 2 via ORAL

## 2013-07-14 MED ORDER — OXYCODONE-ACETAMINOPHEN 5-325 MG PO TABS
1.0000 | ORAL_TABLET | ORAL | Status: DC | PRN
  Administered 2013-07-15 – 2013-07-16 (×5): 2 via ORAL
  Filled 2013-07-14 (×5): qty 2

## 2013-07-14 MED ORDER — CLINDAMYCIN PHOSPHATE 900 MG/50ML IV SOLN
900.0000 mg | Freq: Three times a day (TID) | INTRAVENOUS | Status: DC
  Administered 2013-07-14: 900 mg via INTRAVENOUS

## 2013-07-14 MED ORDER — TETANUS-DIPHTH-ACELL PERTUSSIS 5-2.5-18.5 LF-MCG/0.5 IM SUSP: 0.5000 mL | Freq: Once | INTRAMUSCULAR | Status: DC

## 2013-07-14 MED ORDER — SODIUM CHLORIDE 0.9 % IV SOLN
2.0000 g | Freq: Four times a day (QID) | INTRAVENOUS | Status: DC
  Administered 2013-07-14 – 2013-07-15 (×5): 2 g via INTRAVENOUS
  Filled 2013-07-14 (×6): qty 2000

## 2013-07-14 MED ORDER — LANOLIN HYDROUS EX OINT: 1.0000 "application " | TOPICAL_OINTMENT | CUTANEOUS | Status: DC | PRN

## 2013-07-14 MED ORDER — WITCH HAZEL-GLYCERIN EX PADS: 1.0000 "application " | MEDICATED_PAD | CUTANEOUS | Status: DC | PRN

## 2013-07-14 MED ORDER — LACTATED RINGERS IV SOLN
INTRAVENOUS | Status: DC
  Administered 2013-07-14: 06:00:00 via INTRAVENOUS

## 2013-07-14 MED ORDER — PRENATAL MULTIVITAMIN CH
1.0000 | ORAL_TABLET | Freq: Every day | ORAL | Status: DC
  Administered 2013-07-15: 1 via ORAL
  Filled 2013-07-14: qty 1

## 2013-07-14 MED ORDER — ONDANSETRON HCL 4 MG/2ML IJ SOLN: 4.0000 mg | INTRAMUSCULAR | Status: DC | PRN

## 2013-07-14 MED ORDER — SIMETHICONE 80 MG PO CHEW
80.0000 mg | CHEWABLE_TABLET | Freq: Three times a day (TID) | ORAL | Status: DC
  Administered 2013-07-14 – 2013-07-16 (×8): 80 mg via ORAL

## 2013-07-14 MED ORDER — OXYTOCIN 40 UNITS IN LACTATED RINGERS INFUSION - SIMPLE MED: 62.5000 mL/h | INTRAVENOUS | Status: AC

## 2013-07-14 MED ORDER — METHYLERGONOVINE MALEATE 0.2 MG PO TABS: 0.2000 mg | ORAL_TABLET | ORAL | Status: DC | PRN

## 2013-07-14 MED ORDER — IBUPROFEN 600 MG PO TABS
600.0000 mg | ORAL_TABLET | Freq: Four times a day (QID) | ORAL | Status: DC
  Administered 2013-07-14 – 2013-07-16 (×8): 600 mg via ORAL
  Filled 2013-07-14 (×8): qty 1

## 2013-07-14 MED ORDER — CLINDAMYCIN PHOSPHATE 900 MG/50ML IV SOLN
900.0000 mg | Freq: Once | INTRAVENOUS | Status: DC
  Filled 2013-07-14: qty 50

## 2013-07-14 MED ORDER — DIPHENHYDRAMINE HCL 25 MG PO CAPS: 25.0000 mg | ORAL_CAPSULE | Freq: Four times a day (QID) | ORAL | Status: DC | PRN

## 2013-07-14 MED ORDER — DIBUCAINE 1 % RE OINT: 1.0000 "application " | TOPICAL_OINTMENT | RECTAL | Status: DC | PRN

## 2013-07-14 MED ORDER — CLINDAMYCIN PHOSPHATE 900 MG/50ML IV SOLN
900.0000 mg | Freq: Three times a day (TID) | INTRAVENOUS | Status: DC
  Administered 2013-07-14 – 2013-07-15 (×4): 900 mg via INTRAVENOUS
  Filled 2013-07-14 (×5): qty 50

## 2013-07-14 MED ORDER — METHYLERGONOVINE MALEATE 0.2 MG/ML IJ SOLN: 0.2000 mg | INTRAMUSCULAR | Status: DC | PRN

## 2013-07-14 MED ORDER — MENTHOL 3 MG MT LOZG: 1.0000 | LOZENGE | OROMUCOSAL | Status: DC | PRN

## 2013-07-14 MED ORDER — SODIUM CHLORIDE 0.9 % IV SOLN
2.0000 g | Freq: Once | INTRAVENOUS | Status: AC
  Administered 2013-07-14: 2 g via INTRAVENOUS
  Filled 2013-07-14: qty 2000

## 2013-07-14 MED ORDER — SIMETHICONE 80 MG PO CHEW: 80.0000 mg | CHEWABLE_TABLET | ORAL | Status: DC | PRN

## 2013-07-14 MED ORDER — ZOLPIDEM TARTRATE 5 MG PO TABS: 5.0000 mg | ORAL_TABLET | Freq: Every evening | ORAL | Status: DC | PRN

## 2013-07-14 MED ORDER — ONDANSETRON HCL 4 MG PO TABS: 4.0000 mg | ORAL_TABLET | ORAL | Status: DC | PRN

## 2013-07-14 NOTE — Anesthesia Postprocedure Evaluation (Signed)
  Anesthesia Post-op Note  Anesthesia Post Note  Patient: Ariana Brown  Procedure(s) Performed: Procedure(s) (LRB): Primary CESAREAN SECTION  of baby girl at 2256 APGAR 7/9 (N/A)  Anesthesia type: Epidural  Patient location: PACU  Post pain: Pain level controlled  Post assessment: Post-op Vital signs reviewed  Last Vitals:  Filed Vitals:   07/14/13 0040  BP: 108/65  Pulse: 66  Temp: 37.3 C  Resp: 21    Post vital signs: stable  Level of consciousness: awake  Complications: No apparent anesthesia complications

## 2013-07-14 NOTE — Progress Notes (Signed)
Patient ID: Dalina Samara, female   DOB: 1981/06/16, 32 y.o.   MRN: [id number] Subjective: POD# 1 Information for the patient's newborn:  Elyn, Krogh Girl [place]qu[place] [[id number]]  female   Reports feeling very sleepy and sore Feeding: breast Patient reports tolerating PO.  Breast symptoms: none Pain controlled with ibuprofen (OTC) and narcotic analgesics including Percocet Denies HA/SOB/C/P/N/V/dizziness. Flatus: none. She reports vaginal bleeding as normal, without clots.  She is ambulating, urinating without difficult.     Objective:   VS:  Filed Vitals:   07/14/13 0600 07/14/13 0605 07/14/13 0610 07/14/13 0800  BP: 104/63 98/47 105/66 91/58  Pulse: 65 59 59 64  Temp: 97.9 F (36.6 C)   97.5 F (36.4 C)  TempSrc: Oral   Oral  Resp: 18   20  Height:      Weight:      SpO2: 97% 96% 96% 95%     Intake/Output Summary (Last 24 hours) at 07/14/13 1007 Last data filed at 07/14/13 0800  Gross per 24 hour  Intake   2600 ml  Output   3800 ml  Net  -1200 ml        Recent Labs  07/13/13 0900 07/14/13 0555  WBC 12.1* 22.9*  HGB 13.2 12.2  HCT 36.7 35.1*  PLT 257 251     Blood type: A/Positive/-- (01/20 0000)  Rubella: Immune (01/20 0000)     Physical Exam:  General: alert, cooperative and no distress CV: Regular rate and rhythm, S1S2 present or without murmur or extra heart sounds Resp: clear Abdomen: soft, non-tender, distended, hypoactive bowel sounds Incision: Tegaderm and honeycomb dressing in place with a marked section of bloody drainage Uterine Fundus: firm, below umbilicus, nontender Lochia: minimal Ext: extremities normal, atraumatic, no cyanosis, trace edema, No Homan's sign or signs of DVT      Assessment/Plan: 32 y.o.   POD# 1.  s/p Cesarean Delivery.  Indications: arrest of descent                Principal Problem:   Postpartum care following cesarean delivery (9/1) Active Problems:   Post term pregnancy  Doing well, stable.               Advance diet as  tolerated D/C foley  Ambulate Routine post-op care  Kenard Gower, MSN, CNM 07/14/2013, 10:07 AM

## 2013-07-14 NOTE — Anesthesia Postprocedure Evaluation (Signed)
  Anesthesia Post-op Note  Patient: Ariana Brown  Procedure(s) Performed: Procedure(s): Primary CESAREAN SECTION  of baby girl at 2256 APGAR 7/9 (N/A)  Patient Location: Mother/Baby  Anesthesia Type:Epidural  Level of Consciousness: awake, alert  and oriented  Airway and Oxygen Therapy: Patient Spontanous Breathing  Post-op Pain: mild  Post-op Assessment: Patient's Cardiovascular Status Stable, Respiratory Function Stable, No headache, No backache, No residual numbness and No residual motor weakness  Post-op Vital Signs: stable  Complications: No apparent anesthesia complications

## 2013-07-15 NOTE — Progress Notes (Signed)
POSTOPERATIVE DAY # 1 1/2 S/P cesarean section   S:         Reports feeling well             Tolerating po intake / no nausea / no vomiting / + flatus / no BM             Bleeding is light             Pain controlled with motrin and percocet             Up ad lib / ambulatory/ voiding QS  Newborn breast feeding  /female   O:  VS: BP 81/44  Pulse 61  Temp(Src) 98.1 F (36.7 C) (Oral)  Resp 16  Ht 5' 6.75" (1.695 m)  Wt 92.987 kg (205 lb)  BMI 32.37 kg/m2  SpO2 98%   LABS:               Recent Labs  07/13/13 0900 07/14/13 0555  WBC 12.1* 22.9*  HGB 13.2 12.2  PLT 257 251               Bloodtype: A/Positive/-- (01/20 0000)  Rubella: Immune (01/20 0000)                                             I&O: Intake/Output     09/02 0701 - 09/03 0700 09/03 0701 - 09/04 0700   P.O. 1860    I.V. (mL/kg) 875 (9.4)    Total Intake(mL/kg) 2735 (29.4)    Urine (mL/kg/hr) 1975 (0.9)    Blood     Total Output 1975     Net +760                       Physical Exam:             Alert and Oriented X3  Lungs: Clear and unlabored  Heart: regular rate and rhythm / no mumurs  Abdomen: soft, non-tender, non-distended active BS             Fundus: firm, non-tender, U-1             Dressing intact honeycomb              Incision:  approximated with staples / no erythema / no ecchymosis / no drainage  Perineum: no edema  Lochia: light  Extremities: trace edema, no calf pain or tenderness, negative Homans  A:        POD # 1 1/2 S/P cesarean section             Afebrile postop   P:        Routine postoperative care              DC antibiotic and IV              Advance activity - shower today             Plan DC in am   Marlinda Mike CNM, MSN, [facility] 07/15/2013, 9:55 AM

## 2013-07-16 MED ORDER — OXYCODONE-ACETAMINOPHEN 5-325 MG PO TABS: 1.0000 | ORAL_TABLET | ORAL | Status: DC | PRN

## 2013-07-16 MED ORDER — IBUPROFEN 600 MG PO TABS: 600.0000 mg | ORAL_TABLET | Freq: Four times a day (QID) | ORAL | Status: DC

## 2013-07-16 NOTE — Discharge Summary (Signed)
POSTOPERATIVE DISCHARGE SUMMARY:  Patient IDKatleen Brown MRN: [id number] DOB/AGE: 12-Apr-1981 32 y.o.  Admit date: 07/13/2013 Admission Diagnoses: 40.4 weeks / active labor onset / LGA  Discharge date:  07/16/2013 Discharge Diagnoses: POD 3 s/p CS  Prenatal history: [id number]   EDC : 07/09/2013, by Est. Date of Conception  Prenatal care at [facility]  Primary provider : Taavon Prenatal course complicated by LGA ( EFW 8-15) / + GBS status  Prenatal Labs: ABO, Rh: A/Positive/-- (01/20 0000)  Antibody: Negative (01/20 0000) Rubella: Immune (01/20 0000)  RPR: NON REACTIVE (09/01 0900)  HBsAg: Negative (01/20 0000)  HIV: Non-reactive (01/20 0000)  GTT : NL GBS: Positive (06/10 0000)   Medical / Surgical History :  Past medical history:  Past Medical History  Diagnosis Date  . Abnormal Pap smear   . Postpartum care following cesarean delivery (9/1) 07/14/2013    Past surgical history:  Past Surgical History  Procedure Laterality Date  . Cesarean section N/A 07/13/2013    Procedure: Primary CESAREAN SECTION  of baby girl at 2256 APGAR 7/9;  Surgeon: Lenoard Aden, MD;  Location: [facility];  Service: Obstetrics;  Laterality: N/A;    Family History: History reviewed. No pertinent family history.  Social History:  reports that she has never smoked. She has never used smokeless tobacco. She reports that she does not drink alcohol or use illicit drugs.  Allergies: Hydrocodone   Current Medications at time of admission:  Prior to Admission medications   Medication Sig Start Date End Date Taking? Authorizing Provider  Prenatal Vit-Fe Fumarate-FA (PRENATAL MULTIVITAMIN) TABS tablet Take 1 tablet by mouth daily at 12 noon.   Yes Historical Provider, MD   Intrapartum Course:  Admit for active labor with labor progression to complete dilation with protracted labor curve Pain management: water immersion initially then epidural Complicated by: protracted labor with slow  cervical progression / fever intrapartum (100.3) Interventions required: IV abx for 24 hour / cesarean delivery to arrest of descent  Procedures: Cesarean section delivery on 07/13/2013 with delivery of female newborn by Dr Billy Coast  See operative report for further details APGAR (1 MIN): 7   APGAR (5 MINS): 9    Postoperative / postpartum course:  Uncomplicated with discharge on POD 3   Physical Exam:   VSS: Temp:  [97.2 F (36.2 C)-98 F (36.7 C)] 97.2 F (36.2 C) (09/04 0700) Pulse Rate:  [59-67] 59 (09/04 0700) Resp:  [16-18] 18 (09/04 0700) BP: (96-97)/(62-68) 97/62 mmHg (09/04 0700)  LABS:  Recent Labs  07/14/13 0555  WBC 22.9*  HGB 12.2  PLT 251    General: pleasant / NAD / ambulatory Heart: RRR Lungs: clear  Abdomen: soft and non-tender / non-distended / active BS  Extremities: trace edema / negative Homans  Dressing: intact honeycomb dressing Incision:  approximated with staples / no erythema / no ecchymosis / no drainage  Discharge Instructions:  Discharged Condition: stable  Activity: pelvic rest and postoperative restrictions x 2   Diet: routine  Medications:    Medication List         ibuprofen 600 MG tablet  Commonly known as:  ADVIL,MOTRIN  Take 1 tablet (600 mg total) by mouth every 6 (six) hours.     oxyCODONE-acetaminophen 5-325 MG per tablet  Commonly known as:  PERCOCET/ROXICET  Take 1-2 tablets by mouth every 4 (four) hours as needed.     prenatal multivitamin Tabs tablet  Take 1 tablet by mouth daily at 12  noon.        Wound Care: keep clean and dry / remove honeycomb POD 4 at [facility] with staple removal Postpartum Instructions: [facility] discharge booklet - instructions reviewed  Discharge to: Home  Follow up :  [facility] in 1-3 days for interval visit with midwife for staple removal [facility] in 6 weeks for routine postpartum visit with Dr Billy Coast                Signed: Marlinda Mike CNM, MSN, [facility] 07/16/2013, 9:45 AM

## 2013-07-16 NOTE — Progress Notes (Signed)
POSTOPERATIVE DAY # 2-3 S/P CS   S:         Reports feeling well - ready to go home             Tolerating po intake / no nausea / no vomiting / + flatus / no BM             Bleeding is light             Pain controlled with motrin and percocet             Up ad lib / ambulatory/ voiding QS  Newborn breast feeding  / female   O:  VS: BP 97/62  Pulse 59  Temp(Src) 97.2 F (36.2 C) (Oral)  Resp 18  Ht 5' 6.75" (1.695 m)  Wt 92.987 kg (205 lb)  BMI 32.37 kg/m2  SpO2 98%   LABS:               Recent Labs  07/14/13 0555  WBC 22.9*  HGB 12.2  PLT 251               Bloodtype: A/Positive/-- (01/20 0000)  Rubella: Immune (01/20 0000)                                 Physical Exam:             Alert and Oriented X3  Lungs: Clear and unlabored  Heart: regular rate and rhythm / no mumurs  Abdomen: soft, non-tender, non-distended actiev BS             Fundus: firm, non-tender, U-1             Dressing intact honeycomb              Incision:  approximated with staples / no erythema / no ecchymosis / no drainage  Perineum: no edema  Lochia: light  Extremities: trace edema, no calf pain or tenderness, negative Homans  A:        POD # 2-3 S/P CS             P:        Routine postoperative care              DC home - staple removal at [facility] tomorrow    Marlinda Mike CNM, MSN, [facility] 07/16/2013, 9:37 AM

## 2014-09-13 ENCOUNTER — Encounter (HOSPITAL_COMMUNITY): Payer: Self-pay

## 2015-11-18 LAB — OB RESULTS CONSOLE ABO/RH: RH Type: POSITIVE

## 2015-11-18 LAB — OB RESULTS CONSOLE HIV ANTIBODY (ROUTINE TESTING): HIV: NONREACTIVE

## 2015-11-18 LAB — OB RESULTS CONSOLE GC/CHLAMYDIA
CHLAMYDIA, DNA PROBE: NEGATIVE
Gonorrhea: NEGATIVE

## 2015-11-18 LAB — OB RESULTS CONSOLE RPR: RPR: NONREACTIVE

## 2015-11-18 LAB — OB RESULTS CONSOLE RUBELLA ANTIBODY, IGM: Rubella: NON-IMMUNE/NOT IMMUNE

## 2015-11-18 LAB — OB RESULTS CONSOLE HEPATITIS B SURFACE ANTIGEN: HEP B S AG: NEGATIVE

## 2015-11-18 LAB — OB RESULTS CONSOLE ANTIBODY SCREEN: ANTIBODY SCREEN: NEGATIVE

## 2016-05-03 ENCOUNTER — Other Ambulatory Visit: Payer: Self-pay | Admitting: Obstetrics and Gynecology

## 2016-05-08 LAB — OB RESULTS CONSOLE GBS: STREP GROUP B AG: POSITIVE

## 2016-05-29 ENCOUNTER — Telehealth (HOSPITAL_COMMUNITY): Payer: Self-pay | Admitting: *Deleted

## 2016-05-29 ENCOUNTER — Encounter (HOSPITAL_COMMUNITY): Payer: Self-pay

## 2016-05-29 NOTE — Telephone Encounter (Signed)
Preadmission screen  

## 2016-05-30 ENCOUNTER — Encounter (HOSPITAL_COMMUNITY): Payer: Self-pay

## 2016-06-04 ENCOUNTER — Encounter (HOSPITAL_COMMUNITY)
Admission: RE | Admit: 2016-06-04 | Discharge: 2016-06-04 | Disposition: A | Discharge status: Home / Self Care | Payer: BLUE CROSS/BLUE SHIELD | Source: Ambulatory Visit | Attending: Obstetrics and Gynecology | Admitting: Obstetrics and Gynecology

## 2016-06-04 HISTORY — DX: Anogenital herpesviral infection, unspecified: A60.9

## 2016-06-04 HISTORY — DX: Unspecified abnormal cytological findings in specimens from vagina: R87.629

## 2016-06-04 LAB — CBC
HCT: 32.8 % — ABNORMAL LOW (ref 36.0–46.0)
HEMOGLOBIN: 11.2 g/dL — AB (ref 12.0–15.0)
MCH: 29.2 pg (ref 26.0–34.0)
MCHC: 34.1 g/dL (ref 30.0–36.0)
MCV: 85.4 fL (ref 78.0–100.0)
PLATELETS: 299 10*3/uL (ref 150–400)
RBC: 3.84 MIL/uL — AB (ref 3.87–5.11)
RDW: 13.5 % (ref 11.5–15.5)
WBC: 8.2 10*3/uL (ref 4.0–10.5)

## 2016-06-04 LAB — TYPE AND SCREEN
ABO/RH(D): A POS
ANTIBODY SCREEN: NEGATIVE

## 2016-06-04 LAB — ABO/RH: ABO/RH(D): A POS

## 2016-06-04 NOTE — Patient Instructions (Signed)
20 Audreyanna R Mullenix  06/04/2016   Your procedure is scheduled on:  06/05/2016  Enter through the Main Entrance of [facility] at 0945 AM.  Pick up the phone at the desk and dial [phone].   Call this number if you have problems the morning of surgery: [phone]   Remember:   Do not eat food:After Midnight.  Do not drink clear liquids: After Midnight.  Take these medicines the morning of surgery with A SIP OF WATER: none   Do not wear jewelry, make-up or nail polish.  Do not wear lotions, powders, or perfumes. You may wear deodorant.  Do not shave 48 hours prior to surgery.  Do not bring valuables to the hospital.  [facility] is not   responsible for any belongings or valuables brought to the hospital.  Contacts, dentures or bridgework may not be worn into surgery.  Leave suitcase in the car. After surgery it may be brought to your room.  For patients admitted to the hospital, checkout time is 11:00 AM the day of              discharge.   Patients discharged the day of surgery will not be allowed to drive             home.  Name and phone number of your driver: na  Special Instructions:   N/A   Please read over the following fact sheets that you were given:   Surgical Site Infection Prevention

## 2016-06-04 NOTE — H&P (Signed)
Ariana Brown is a 35 y.o. female presenting for rpt csection.. OB History    Gravida Para Term Preterm AB Living   2 1 1     1    SAB TAB Ectopic Multiple Live Births                 Past Medical History:  Diagnosis Date  . Abnormal Pap smear   . HSV (herpes simplex virus) anogenital infection   . Postpartum care following cesarean delivery (9/1) 07/14/2013  . Vaginal Pap smear, abnormal    Past Surgical History:  Procedure Laterality Date  . CESAREAN SECTION N/A 07/13/2013   Procedure: Primary CESAREAN SECTION  of baby girl at 2256 APGAR 7/9;  Surgeon: Lenoard Aden, MD;  Location: [facility];  Service: Obstetrics;  Laterality: N/A;   Family History: family history is not on file. Social History:  reports that she has never smoked. She has never used smokeless tobacco. She reports that she does not drink alcohol or use drugs.     Maternal Diabetes: No Genetic Screening: Normal Maternal Ultrasounds/Referrals: Normal Fetal Ultrasounds or other Referrals:  None Maternal Substance Abuse:  No Significant Maternal Medications:  None Significant Maternal Lab Results:  None Other Comments:  None  Review of Systems  Constitutional: Negative.    Maternal Medical History:  Fetal activity: Perceived fetal activity is normal.   Last perceived fetal movement was greater than 24 hours ago.    Prenatal complications: no prenatal complications Prenatal Complications - Diabetes: none.      unknown if currently breastfeeding. Maternal Exam:  Uterine Assessment: Contraction strength is mild.  Contraction frequency is rare.   Abdomen: Patient reports no abdominal tenderness. Surgical scars: low transverse.   Fetal presentation: vertex  Introitus: Normal vulva. Normal vagina.  Ferning test: not done.  Nitrazine test: not done. Amniotic fluid character: not assessed.  Pelvis: questionable for delivery.   Cervix: Cervix evaluated by digital exam.     Physical Exam  Nursing note and  vitals reviewed. Constitutional: She is oriented to person, place, and time. She appears well-developed and well-nourished.  HENT:  Head: Normocephalic.  Neck: Normal range of motion.  Cardiovascular: Normal rate and regular rhythm.   Respiratory: Effort normal and breath sounds normal.  GI: Soft. Bowel sounds are normal.  Genitourinary: Vagina normal and uterus normal.  Musculoskeletal: Normal range of motion.  Neurological: She is alert and oriented to person, place, and time. She has normal reflexes.  Skin: Skin is warm and dry.  Psychiatric: She has a normal mood and affect.    Prenatal labs: ABO, Rh: --/--/A POS (07/24 0177) Antibody: NEG (07/24 9390) Rubella: Nonimmune (01/06 0000) RPR: Nonreactive (01/06 0000)  HBsAg: Negative (01/06 0000)  HIV: Non-reactive (01/06 0000)  GBS: Positive (06/27 0000)   Assessment/Plan: Term IUP Rpt csection Consent done Risks vs benefits noted   Ariana Brown J 06/04/2016, 9:33 PM

## 2016-06-05 ENCOUNTER — Encounter (HOSPITAL_COMMUNITY)
Admission: RE | Disposition: A | Discharge status: Home / Self Care | Payer: Self-pay | Source: Ambulatory Visit | Attending: Obstetrics and Gynecology

## 2016-06-05 ENCOUNTER — Encounter (HOSPITAL_COMMUNITY): Payer: Self-pay | Admitting: *Deleted

## 2016-06-05 ENCOUNTER — Inpatient Hospital Stay (HOSPITAL_COMMUNITY): Payer: BLUE CROSS/BLUE SHIELD | Admitting: Certified Registered Nurse Anesthetist

## 2016-06-05 ENCOUNTER — Inpatient Hospital Stay (HOSPITAL_COMMUNITY)
Admission: RE | Admit: 2016-06-05 | Discharge: 2016-06-07 | DRG: 766 | Disposition: A | Discharge status: Home / Self Care | Payer: BLUE CROSS/BLUE SHIELD | Source: Ambulatory Visit | Attending: Obstetrics and Gynecology | Admitting: Obstetrics and Gynecology

## 2016-06-05 DIAGNOSIS — D649 Anemia, unspecified: Secondary | ICD-10-CM | POA: Diagnosis present

## 2016-06-05 DIAGNOSIS — O9902 Anemia complicating childbirth: Secondary | ICD-10-CM | POA: Diagnosis present

## 2016-06-05 DIAGNOSIS — Z3A39 39 weeks gestation of pregnancy: Secondary | ICD-10-CM

## 2016-06-05 DIAGNOSIS — O99824 Streptococcus B carrier state complicating childbirth: Secondary | ICD-10-CM | POA: Diagnosis present

## 2016-06-05 DIAGNOSIS — O34219 Maternal care for unspecified type scar from previous cesarean delivery: Secondary | ICD-10-CM | POA: Diagnosis present

## 2016-06-05 DIAGNOSIS — O34211 Maternal care for low transverse scar from previous cesarean delivery: Secondary | ICD-10-CM | POA: Diagnosis present

## 2016-06-05 DIAGNOSIS — O99019 Anemia complicating pregnancy, unspecified trimester: Secondary | ICD-10-CM | POA: Diagnosis present

## 2016-06-05 LAB — SYPHILIS: RPR W/REFLEX TO RPR TITER AND TREPONEMAL ANTIBODIES, TRADITIONAL SCREENING AND DIAGNOSIS ALGORITHM: RPR Ser Ql: NONREACTIVE

## 2016-06-05 SURGERY — Surgical Case
Anesthesia: Spinal

## 2016-06-05 MED ORDER — PROMETHAZINE HCL 25 MG/ML IJ SOLN: 6.2500 mg | INTRAMUSCULAR | Status: DC | PRN

## 2016-06-05 MED ORDER — SCOPOLAMINE 1 MG/3DAYS TD PT72
1.0000 | MEDICATED_PATCH | Freq: Once | TRANSDERMAL | Status: DC
  Filled 2016-06-05: qty 1

## 2016-06-05 MED ORDER — DIPHENHYDRAMINE HCL 50 MG/ML IJ SOLN: 12.5000 mg | INTRAMUSCULAR | Status: DC | PRN

## 2016-06-05 MED ORDER — SODIUM CHLORIDE 0.9% FLUSH: 3.0000 mL | INTRAVENOUS | Status: DC | PRN

## 2016-06-05 MED ORDER — NALOXONE HCL 2 MG/2ML IJ SOSY
1.0000 ug/kg/h | PREFILLED_SYRINGE | INTRAVENOUS | Status: DC | PRN
  Filled 2016-06-05: qty 2

## 2016-06-05 MED ORDER — KETOROLAC TROMETHAMINE 30 MG/ML IJ SOLN
INTRAMUSCULAR | Status: AC
  Filled 2016-06-05: qty 1

## 2016-06-05 MED ORDER — KETOROLAC TROMETHAMINE 30 MG/ML IJ SOLN: 30.0000 mg | Freq: Once | INTRAMUSCULAR | Status: DC

## 2016-06-05 MED ORDER — MORPHINE SULFATE-NACL 0.5-0.9 MG/ML-% IV SOSY
PREFILLED_SYRINGE | INTRAVENOUS | Status: AC
  Filled 2016-06-05: qty 1

## 2016-06-05 MED ORDER — IBUPROFEN 600 MG PO TABS: 600.0000 mg | ORAL_TABLET | Freq: Four times a day (QID) | ORAL | Status: DC | PRN

## 2016-06-05 MED ORDER — IBUPROFEN 600 MG PO TABS
600.0000 mg | ORAL_TABLET | Freq: Four times a day (QID) | ORAL | Status: DC
  Administered 2016-06-06 – 2016-06-07 (×7): 600 mg via ORAL
  Filled 2016-06-05 (×7): qty 1

## 2016-06-05 MED ORDER — SCOPOLAMINE 1 MG/3DAYS TD PT72
1.0000 | MEDICATED_PATCH | Freq: Once | TRANSDERMAL | Status: DC
  Administered 2016-06-05: 1.5 mg via TRANSDERMAL

## 2016-06-05 MED ORDER — SENNOSIDES-DOCUSATE SODIUM 8.6-50 MG PO TABS
2.0000 | ORAL_TABLET | ORAL | Status: DC
Start: 2016-06-06 — End: 2016-06-07
  Administered 2016-06-06 (×2): 2 via ORAL
  Filled 2016-06-05 (×2): qty 2

## 2016-06-05 MED ORDER — DIPHENHYDRAMINE HCL 25 MG PO CAPS
25.0000 mg | ORAL_CAPSULE | ORAL | Status: DC | PRN
  Administered 2016-06-05: 25 mg via ORAL
  Filled 2016-06-05 (×2): qty 1

## 2016-06-05 MED ORDER — ACETAMINOPHEN 500 MG PO TABS: 1000.0000 mg | ORAL_TABLET | Freq: Four times a day (QID) | ORAL | Status: DC

## 2016-06-05 MED ORDER — SIMETHICONE 80 MG PO CHEW
80.0000 mg | CHEWABLE_TABLET | Freq: Three times a day (TID) | ORAL | Status: DC
  Administered 2016-06-05 – 2016-06-07 (×5): 80 mg via ORAL
  Filled 2016-06-05 (×5): qty 1

## 2016-06-05 MED ORDER — KETOROLAC TROMETHAMINE 30 MG/ML IJ SOLN: 30.0000 mg | Freq: Four times a day (QID) | INTRAMUSCULAR | Status: AC | PRN

## 2016-06-05 MED ORDER — OXYTOCIN 10 UNIT/ML IJ SOLN
INTRAMUSCULAR | Status: AC
  Filled 2016-06-05: qty 4

## 2016-06-05 MED ORDER — ACETAMINOPHEN 500 MG PO TABS
1000.0000 mg | ORAL_TABLET | Freq: Four times a day (QID) | ORAL | Status: DC
  Administered 2016-06-05: 1000 mg via ORAL
  Filled 2016-06-05: qty 2

## 2016-06-05 MED ORDER — NALOXONE HCL 0.4 MG/ML IJ SOLN: 0.4000 mg | INTRAMUSCULAR | Status: DC | PRN

## 2016-06-05 MED ORDER — BUPIVACAINE IN DEXTROSE 0.75-8.25 % IT SOLN
INTRATHECAL | Status: DC | PRN
  Administered 2016-06-05: 1.4 mL via INTRATHECAL

## 2016-06-05 MED ORDER — PHENYLEPHRINE 8 MG IN D5W 100 ML (0.08MG/ML) PREMIX OPTIME
INJECTION | INTRAVENOUS | Status: AC
  Filled 2016-06-05: qty 100

## 2016-06-05 MED ORDER — MENTHOL 3 MG MT LOZG: 1.0000 | LOZENGE | OROMUCOSAL | Status: DC | PRN

## 2016-06-05 MED ORDER — ONDANSETRON HCL 4 MG/2ML IJ SOLN: 4.0000 mg | Freq: Three times a day (TID) | INTRAMUSCULAR | Status: DC | PRN

## 2016-06-05 MED ORDER — KETOROLAC TROMETHAMINE 30 MG/ML IJ SOLN
30.0000 mg | Freq: Four times a day (QID) | INTRAMUSCULAR | Status: DC | PRN
  Administered 2016-06-05: 30 mg via INTRAMUSCULAR

## 2016-06-05 MED ORDER — FENTANYL CITRATE (PF) 100 MCG/2ML IJ SOLN
INTRAMUSCULAR | Status: DC | PRN
  Administered 2016-06-05: 20 ug via INTRATHECAL

## 2016-06-05 MED ORDER — KETOROLAC TROMETHAMINE 30 MG/ML IJ SOLN: 30.0000 mg | Freq: Four times a day (QID) | INTRAMUSCULAR | Status: DC | PRN

## 2016-06-05 MED ORDER — HYDROMORPHONE HCL 1 MG/ML IJ SOLN
0.2500 mg | INTRAMUSCULAR | Status: DC | PRN
  Filled 2016-06-05: qty 1

## 2016-06-05 MED ORDER — DEXAMETHASONE SODIUM PHOSPHATE 4 MG/ML IJ SOLN
INTRAMUSCULAR | Status: AC
  Filled 2016-06-05: qty 1

## 2016-06-05 MED ORDER — HYDROMORPHONE HCL 1 MG/ML IJ SOLN
INTRAMUSCULAR | Status: AC
  Administered 2016-06-05: 0.5 mg via INTRAVENOUS
  Filled 2016-06-05: qty 1

## 2016-06-05 MED ORDER — SIMETHICONE 80 MG PO CHEW
80.0000 mg | CHEWABLE_TABLET | ORAL | Status: DC
  Administered 2016-06-06 (×2): 80 mg via ORAL
  Filled 2016-06-05 (×2): qty 1

## 2016-06-05 MED ORDER — ONDANSETRON HCL 4 MG/2ML IJ SOLN
INTRAMUSCULAR | Status: DC | PRN
  Administered 2016-06-05: 4 mg via INTRAVENOUS

## 2016-06-05 MED ORDER — CEFAZOLIN SODIUM-DEXTROSE 2-4 GM/100ML-% IV SOLN
2.0000 g | INTRAVENOUS | Status: AC
  Administered 2016-06-05: 2 g via INTRAVENOUS

## 2016-06-05 MED ORDER — DIPHENHYDRAMINE HCL 25 MG PO CAPS
25.0000 mg | ORAL_CAPSULE | ORAL | Status: DC | PRN
  Filled 2016-06-05: qty 1

## 2016-06-05 MED ORDER — HYDROMORPHONE HCL 1 MG/ML IJ SOLN
0.5000 mg | INTRAMUSCULAR | Status: DC | PRN
  Administered 2016-06-05 (×4): 0.5 mg via INTRAVENOUS
  Filled 2016-06-05: qty 1

## 2016-06-05 MED ORDER — MEPERIDINE HCL 25 MG/ML IJ SOLN: 6.2500 mg | INTRAMUSCULAR | Status: DC | PRN

## 2016-06-05 MED ORDER — MORPHINE SULFATE-NACL 0.5-0.9 MG/ML-% IV SOSY
PREFILLED_SYRINGE | INTRAVENOUS | Status: DC | PRN
  Administered 2016-06-05: .2 mg via INTRATHECAL

## 2016-06-05 MED ORDER — WITCH HAZEL-GLYCERIN EX PADS: 1.0000 "application " | MEDICATED_PAD | CUTANEOUS | Status: DC | PRN

## 2016-06-05 MED ORDER — SCOPOLAMINE 1 MG/3DAYS TD PT72
MEDICATED_PATCH | TRANSDERMAL | Status: AC
  Filled 2016-06-05: qty 1

## 2016-06-05 MED ORDER — TETANUS-DIPHTH-ACELL PERTUSSIS 5-2.5-18.5 LF-MCG/0.5 IM SUSP: 0.5000 mL | Freq: Once | INTRAMUSCULAR | Status: DC

## 2016-06-05 MED ORDER — PHENYLEPHRINE 8 MG IN D5W 100 ML (0.08MG/ML) PREMIX OPTIME
INJECTION | INTRAVENOUS | Status: DC | PRN
Start: 2016-06-05 — End: 2016-06-05
  Administered 2016-06-05: 60 ug/min via INTRAVENOUS

## 2016-06-05 MED ORDER — NALOXONE HCL 2 MG/2ML IJ SOSY
1.0000 ug/kg/h | PREFILLED_SYRINGE | INTRAVENOUS | Status: DC | PRN
  Filled 2016-06-05 (×7): qty 2

## 2016-06-05 MED ORDER — BUPIVACAINE HCL (PF) 0.25 % IJ SOLN
INTRAMUSCULAR | Status: AC
  Filled 2016-06-05: qty 10

## 2016-06-05 MED ORDER — ONDANSETRON HCL 4 MG/2ML IJ SOLN
INTRAMUSCULAR | Status: AC
  Filled 2016-06-05: qty 2

## 2016-06-05 MED ORDER — SCOPOLAMINE 1 MG/3DAYS TD PT72: 1.0000 | MEDICATED_PATCH | Freq: Once | TRANSDERMAL | Status: DC

## 2016-06-05 MED ORDER — FENTANYL CITRATE (PF) 100 MCG/2ML IJ SOLN: 25.0000 ug | INTRAMUSCULAR | Status: DC | PRN

## 2016-06-05 MED ORDER — OXYTOCIN 10 UNIT/ML IJ SOLN
INTRAVENOUS | Status: DC | PRN
  Administered 2016-06-05: 40 [IU] via INTRAVENOUS

## 2016-06-05 MED ORDER — ZOLPIDEM TARTRATE 5 MG PO TABS
5.0000 mg | ORAL_TABLET | Freq: Every evening | ORAL | Status: DC | PRN
Start: 2016-06-05 — End: 2016-06-06

## 2016-06-05 MED ORDER — NALBUPHINE HCL 10 MG/ML IJ SOLN: 5.0000 mg | Freq: Once | INTRAMUSCULAR | Status: DC | PRN

## 2016-06-05 MED ORDER — DIPHENHYDRAMINE HCL 50 MG/ML IJ SOLN
INTRAMUSCULAR | Status: AC
  Filled 2016-06-05: qty 1

## 2016-06-05 MED ORDER — DIBUCAINE 1 % RE OINT: 1.0000 "application " | TOPICAL_OINTMENT | RECTAL | Status: DC | PRN

## 2016-06-05 MED ORDER — METHYLERGONOVINE MALEATE 0.2 MG/ML IJ SOLN: 0.2000 mg | INTRAMUSCULAR | Status: DC | PRN

## 2016-06-05 MED ORDER — OXYTOCIN 40 UNITS IN LACTATED RINGERS INFUSION - SIMPLE MED: 2.5000 [IU]/h | INTRAVENOUS | Status: AC

## 2016-06-05 MED ORDER — DEXAMETHASONE SODIUM PHOSPHATE 4 MG/ML IJ SOLN
INTRAMUSCULAR | Status: DC | PRN
  Administered 2016-06-05: 4 mg via INTRAVENOUS

## 2016-06-05 MED ORDER — NALBUPHINE HCL 10 MG/ML IJ SOLN: 5.0000 mg | INTRAMUSCULAR | Status: DC | PRN

## 2016-06-05 MED ORDER — FENTANYL CITRATE (PF) 100 MCG/2ML IJ SOLN
INTRAMUSCULAR | Status: AC
  Filled 2016-06-05: qty 2

## 2016-06-05 MED ORDER — OXYCODONE-ACETAMINOPHEN 5-325 MG PO TABS: 2.0000 | ORAL_TABLET | ORAL | Status: DC | PRN

## 2016-06-05 MED ORDER — METHYLERGONOVINE MALEATE 0.2 MG PO TABS: 0.2000 mg | ORAL_TABLET | ORAL | Status: DC | PRN

## 2016-06-05 MED ORDER — DIPHENHYDRAMINE HCL 50 MG/ML IJ SOLN
12.5000 mg | INTRAMUSCULAR | Status: DC | PRN
  Administered 2016-06-05: 12.5 mg via INTRAVENOUS

## 2016-06-05 MED ORDER — DIPHENHYDRAMINE HCL 25 MG PO CAPS: 25.0000 mg | ORAL_CAPSULE | ORAL | Status: DC | PRN

## 2016-06-05 MED ORDER — COCONUT OIL OIL: 1.0000 "application " | TOPICAL_OIL | Status: DC | PRN

## 2016-06-05 MED ORDER — LACTATED RINGERS IV SOLN
INTRAVENOUS | Status: DC | PRN
  Administered 2016-06-05: 12:00:00 via INTRAVENOUS

## 2016-06-05 MED ORDER — ACETAMINOPHEN 325 MG PO TABS: 650.0000 mg | ORAL_TABLET | ORAL | Status: DC | PRN

## 2016-06-05 MED ORDER — CEFAZOLIN SODIUM-DEXTROSE 2-4 GM/100ML-% IV SOLN
INTRAVENOUS | Status: AC
Start: 2016-06-05 — End: 2016-06-05
  Filled 2016-06-05: qty 100

## 2016-06-05 MED ORDER — LACTATED RINGERS IV SOLN
INTRAVENOUS | Status: DC
  Administered 2016-06-05: 22:00:00 via INTRAVENOUS

## 2016-06-05 MED ORDER — KETOROLAC TROMETHAMINE 30 MG/ML IJ SOLN
30.0000 mg | Freq: Four times a day (QID) | INTRAMUSCULAR | Status: AC | PRN
  Administered 2016-06-05: 30 mg via INTRAVENOUS
  Filled 2016-06-05: qty 1

## 2016-06-05 MED ORDER — OXYCODONE-ACETAMINOPHEN 5-325 MG PO TABS
1.0000 | ORAL_TABLET | ORAL | Status: DC | PRN
  Administered 2016-06-06 – 2016-06-07 (×8): 1 via ORAL
  Filled 2016-06-05 (×8): qty 1

## 2016-06-05 MED ORDER — LACTATED RINGERS IV SOLN
INTRAVENOUS | Status: DC
  Administered 2016-06-05 (×3): via INTRAVENOUS

## 2016-06-05 MED ORDER — DIPHENHYDRAMINE HCL 25 MG PO CAPS: 25.0000 mg | ORAL_CAPSULE | Freq: Four times a day (QID) | ORAL | Status: DC | PRN

## 2016-06-05 MED ORDER — BUPIVACAINE HCL (PF) 0.25 % IJ SOLN
INTRAMUSCULAR | Status: DC | PRN
  Administered 2016-06-05: 20 mL

## 2016-06-05 MED ORDER — PRENATAL MULTIVITAMIN CH
1.0000 | ORAL_TABLET | Freq: Every day | ORAL | Status: DC
  Administered 2016-06-05 – 2016-06-07 (×3): 1 via ORAL
  Filled 2016-06-05 (×3): qty 1

## 2016-06-05 MED ORDER — SIMETHICONE 80 MG PO CHEW: 80.0000 mg | CHEWABLE_TABLET | ORAL | Status: DC | PRN

## 2016-06-05 SURGICAL SUPPLY — 38 items
APL SKNCLS STERI-STRIP NONHPOA (GAUZE/BANDAGES/DRESSINGS) ×1
BENZOIN TINCTURE PRP APPL 2/3 (GAUZE/BANDAGES/DRESSINGS) ×2 IMPLANT
CHLORAPREP W/TINT 26ML (MISCELLANEOUS) ×3 IMPLANT
CLAMP CORD UMBIL (MISCELLANEOUS) IMPLANT
CLOSURE WOUND 1/2 X4 (GAUZE/BANDAGES/DRESSINGS) ×1
CLOTH BEACON ORANGE TIMEOUT ST (SAFETY) ×3 IMPLANT
CONTAINER PREFILL 10% NBF 15ML (MISCELLANEOUS) IMPLANT
DRSG OPSITE POSTOP 4X10 (GAUZE/BANDAGES/DRESSINGS) ×3 IMPLANT
ELECT REM PT RETURN 9FT ADLT (ELECTROSURGICAL) ×3
ELECTRODE REM PT RTRN 9FT ADLT (ELECTROSURGICAL) ×1 IMPLANT
EXTRACTOR VACUUM M CUP 4 TUBE (SUCTIONS) IMPLANT
EXTRACTOR VACUUM M CUP 4' TUBE (SUCTIONS)
GLOVE BIO SURGEON STRL SZ7.5 (GLOVE) ×3 IMPLANT
GLOVE BIOGEL PI IND STRL 7.0 (GLOVE) ×1 IMPLANT
GLOVE BIOGEL PI INDICATOR 7.0 (GLOVE) ×2
GOWN STRL REUS W/TWL LRG LVL3 (GOWN DISPOSABLE) ×6 IMPLANT
KIT ABG SYR 3ML LUER SLIP (SYRINGE) IMPLANT
NDL HYPO 25X5/8 SAFETYGLIDE (NEEDLE) IMPLANT
NEEDLE HYPO 22GX1.5 SAFETY (NEEDLE) ×3 IMPLANT
NEEDLE HYPO 25X5/8 SAFETYGLIDE (NEEDLE) IMPLANT
NEEDLE SPNL 20GX3.5 QUINCKE YW (NEEDLE) IMPLANT
NS IRRIG 1000ML POUR BTL (IV SOLUTION) ×3 IMPLANT
PACK C SECTION WH (CUSTOM PROCEDURE TRAY) ×3 IMPLANT
PENCIL SMOKE EVAC W/HOLSTER (ELECTROSURGICAL) ×3 IMPLANT
STRIP CLOSURE SKIN 1/2X4 (GAUZE/BANDAGES/DRESSINGS) ×2 IMPLANT
SUT MNCRL 0 VIOLET CTX 36 (SUTURE) ×2 IMPLANT
SUT MNCRL AB 3-0 PS2 27 (SUTURE) ×2 IMPLANT
SUT MON AB 2-0 CT1 27 (SUTURE) ×3 IMPLANT
SUT MON AB-0 CT1 36 (SUTURE) ×6 IMPLANT
SUT MONOCRYL 0 CTX 36 (SUTURE) ×4
SUT PLAIN 0 NONE (SUTURE) IMPLANT
SUT PLAIN 2 0 (SUTURE)
SUT PLAIN 2 0 XLH (SUTURE) ×2 IMPLANT
SUT PLAIN ABS 2-0 CT1 27XMFL (SUTURE) IMPLANT
SYR 20CC LL (SYRINGE) IMPLANT
SYR CONTROL 10ML LL (SYRINGE) ×3 IMPLANT
TOWEL OR 17X24 6PK STRL BLUE (TOWEL DISPOSABLE) ×3 IMPLANT
TRAY FOLEY CATH SILVER 14FR (SET/KITS/TRAYS/PACK) ×3 IMPLANT

## 2016-06-05 NOTE — Anesthesia Preprocedure Evaluation (Signed)
Anesthesia Evaluation  Patient identified by MRN, date of birth, ID band Patient awake    Reviewed: Allergy & Precautions, H&P , NPO status , Patient's Chart, lab work & pertinent test results  Airway Mallampati: I  TM Distance: >3 FB Neck ROM: full    Dental no notable dental hx.    Pulmonary neg pulmonary ROS,    Pulmonary exam normal        Cardiovascular negative cardio ROS Normal cardiovascular exam     Neuro/Psych negative neurological ROS  negative psych ROS   GI/Hepatic negative GI ROS, Neg liver ROS,   Endo/Other  negative endocrine ROS  Renal/GU negative Renal ROS  negative genitourinary   Musculoskeletal negative musculoskeletal ROS (+)   Abdominal (+) + obese,   Peds  Hematology negative hematology ROS (+)   Anesthesia Other Findings   Reproductive/Obstetrics (+) Pregnancy                             Anesthesia Physical Anesthesia Plan  ASA: II  Anesthesia Plan: Spinal   Post-op Pain Management:    Induction:   Airway Management Planned:   Additional Equipment:   Intra-op Plan:   Post-operative Plan:   Informed Consent: I have reviewed the patients History and Physical, chart, labs and discussed the procedure including the risks, benefits and alternatives for the proposed anesthesia with the patient or authorized representative who has indicated his/her understanding and acceptance.     Plan Discussed with: CRNA and Surgeon  Anesthesia Plan Comments:         Anesthesia Quick Evaluation

## 2016-06-05 NOTE — Addendum Note (Signed)
Addendum  created 06/05/16 1402 by Leilani Able, MD   Order sets accessed

## 2016-06-05 NOTE — Lactation Note (Signed)
This note was copied from a baby's chart. Lactation Consultation Note Initial consult with this mom and term baby, now 3 hours old. Baby was in crib, and skin to skin encouraged. Once skin to skin, mom attempted to latch baby in cross cradle hold, . He latched and went to sleep. Baby left skin to skin, while I did teaching from the Bby and Me book, and reviewed lactation services. Mom knows to call for questions/concerns. Parents very receptive to teaching.  Patient Name: Boy Maydelle Kindler [facility]'N Date: 06/05/2016 Reason for consult: Initial assessment   Maternal Data Formula Feeding for Exclusion: No Has patient been taught Hand Expression?: Yes Does the patient have breastfeeding experience prior to this delivery?: Yes  Feeding Feeding Type: Breast Fed  LATCH Score/Interventions Latch: Too sleepy or reluctant, no latch achieved, no sucking elicited. Intervention(s):  (small deops of colostrum with hand expression)     Type of Nipple: Everted at rest and after stimulation              Lactation Tools Discussed/Used     Consult Status Consult Status: Follow-up Date: 06/06/16 Follow-up type: In-patient    Alfred Levins 06/05/2016, 3:12 PM

## 2016-06-05 NOTE — Anesthesia Procedure Notes (Addendum)
Spinal  Patient location during procedure: OR Start time: 06/05/2016 11:20 AM End time: 06/05/2016 11:30 AM Staffing Anesthesiologist: Leilani Able Performed: anesthesiologist  Preanesthetic Checklist Completed: patient identified, surgical consent, pre-op evaluation, timeout performed, IV checked, risks and benefits discussed and monitors and equipment checked Spinal Block Patient position: sitting Prep: site prepped and draped and DuraPrep Patient monitoring: heart rate, cardiac monitor, continuous pulse ox and blood pressure Approach: midline Location: L3-4 Injection technique: single-shot Needle Needle type: Sprotte  Needle gauge: 24 G Needle length: 9 cm Needle insertion depth: 6 cm Assessment Sensory level: T4

## 2016-06-05 NOTE — Op Note (Signed)
Cesarean Section Procedure Note  Indications: previous uterine incision kerr x one  Pre-operative Diagnosis: 39 week 0 day pregnancy.  Post-operative Diagnosis: same  Surgeon: Lenoard Aden   Assistants: Montez Hageman, CNM  Anesthesia: Local anesthesia 0.25.% bupivacaine and Spinal anesthesia  ASA Class: 2  Procedure Details  The patient was seen in the Holding Room. The risks, benefits, complications, treatment options, and expected outcomes were discussed with the patient.  The patient concurred with the proposed plan, giving informed consent. The risks of anesthesia, infection, bleeding and possible injury to other organs discussed. Injury to bowel, bladder, or ureter with possible need for repair discussed. Possible need for transfusion with secondary risks of hepatitis or HIV acquisition discussed. Post operative complications to include but not limited to DVT, PE and Pneumonia noted. The site of surgery properly noted/marked. The patient was taken to Operating Room # 9, identified as Ariana Brown and the procedure verified as C-Section Delivery. A Time Out was held and the above information confirmed.  After induction of anesthesia, the patient was draped and prepped in the usual sterile manner. A Pfannenstiel incision was made and carried down through the subcutaneous tissue to the fascia. Fascial incision was made and extended transversely using Mayo scissors. The fascia was separated from the underlying rectus tissue superiorly and inferiorly. The peritoneum was identified and entered. Peritoneal incision was extended longitudinally. The utero-vesical peritoneal reflection was incised transversely and the bladder flap was bluntly freed from the lower uterine segment. A low transverse uterine incision(Kerr hysterotomy) was made. Delivered from OT presentation was a  female with Apgar scores of 8 at one minute and 9 at five minutes. Bulb suctioning gently performed. Neonatal team in  attendance.After the umbilical cord was clamped and cut cord blood was obtained for evaluation. The placenta was removed intact and appeared normal. The uterus was curetted with a dry lap pack. Good hemostasis was noted.The uterine outline, tubes and ovaries appeared normal. The uterine incision was closed with running locked sutures of 0 Monocryl x 2 layers. Hemostasis was observed. Lavage was carried out until clear.The parietal peritoneum was closed with a running 2-0 Monocryl suture. The fascia was then reapproximated with running sutures of 0 Monocryl. The skin was reapproximated 3-0 monocryl after [place] closure with 2-0 plain.  Instrument, sponge, and needle counts were correct prior the abdominal closure and at the conclusion of the case.   Findings: FTLM, Anterior placenta, nl adnexa  Estimated Blood Loss:  800         Drains: foley                 Specimens: placenta                 Complications:  None; patient tolerated the procedure well.         Disposition: PACU - hemodynamically stable.         Condition: stable  Attending Attestation: I performed the procedure.

## 2016-06-05 NOTE — Progress Notes (Signed)
Patient ID: Ariana Brown, female   DOB: 09-Sep-1981, 35 y.o.   MRN: [id number] Patient seen and examined. Consent witnessed and signed. No changes noted. Update completed.

## 2016-06-05 NOTE — Transfer of Care (Signed)
Immediate Anesthesia Transfer of Care Note  Patient: Ariana Brown  Procedure(s) Performed: Procedure(s) with comments: Repeat CESAREAN SECTION (N/A) - EDD: 06/12/16 Allergy: Hydrocodone  Patient Location: PACU  Anesthesia Type:Spinal  Level of Consciousness: awake, alert , oriented and patient cooperative  Airway & Oxygen Therapy: Patient Spontanous Breathing  Post-op Assessment: Report given to RN and Post -op Vital signs reviewed and stable  Post vital signs: Reviewed and stable  Last Vitals:  Vitals:   06/05/16 0945  BP: 129/83  Pulse: 79  Temp: 36.6 C    Last Pain:  Vitals:   06/05/16 0945  TempSrc: Oral         Complications: No apparent anesthesia complications

## 2016-06-05 NOTE — Anesthesia Postprocedure Evaluation (Signed)
Anesthesia Post Note  Patient: Ariana Brown  Procedure(s) Performed: Procedure(s) (LRB): Repeat CESAREAN SECTION (N/A)  Patient location during evaluation: PACU Anesthesia Type: Spinal Level of consciousness: awake Pain management: pain level controlled Vital Signs Assessment: post-procedure vital signs reviewed and stable Respiratory status: spontaneous breathing Cardiovascular status: stable Postop Assessment: no headache, no backache, spinal receding, patient able to bend at knees and no signs of nausea or vomiting Anesthetic complications: no     Last Vitals:  Vitals:   06/05/16 0945 06/05/16 1233  BP: 129/83   Pulse: 79   Temp: 36.6 C 36.6 C    Last Pain:  Vitals:   06/05/16 1233  TempSrc: Oral   Pain Goal:                 Yaseen Gilberg JR,JOHN Desare Duddy

## 2016-06-06 DIAGNOSIS — O99019 Anemia complicating pregnancy, unspecified trimester: Secondary | ICD-10-CM | POA: Diagnosis present

## 2016-06-06 LAB — CBC
HCT: 29.5 % — ABNORMAL LOW (ref 36.0–46.0)
Hemoglobin: 9.8 g/dL — ABNORMAL LOW (ref 12.0–15.0)
MCH: 28.6 pg (ref 26.0–34.0)
MCHC: 33.2 g/dL (ref 30.0–36.0)
MCV: 86 fL (ref 78.0–100.0)
PLATELETS: 248 10*3/uL (ref 150–400)
RBC: 3.43 MIL/uL — ABNORMAL LOW (ref 3.87–5.11)
RDW: 13.6 % (ref 11.5–15.5)
WBC: 12 10*3/uL — AB (ref 4.0–10.5)

## 2016-06-06 MED ORDER — ACETAMINOPHEN 500 MG PO TABS
1000.0000 mg | ORAL_TABLET | Freq: Four times a day (QID) | ORAL | Status: DC | PRN
Start: 2016-06-06 — End: 2016-06-06

## 2016-06-06 MED ORDER — ACETAMINOPHEN 500 MG PO TABS: 1000.0000 mg | ORAL_TABLET | Freq: Four times a day (QID) | ORAL | Status: DC | PRN

## 2016-06-06 NOTE — Anesthesia Postprocedure Evaluation (Signed)
Anesthesia Post Note  Patient: Emmory R Mcveigh  Procedure(s) Performed: Procedure(s) (LRB): Repeat CESAREAN SECTION (N/A)  Patient location during evaluation: Mother Baby Anesthesia Type: Spinal Level of consciousness: awake and alert and oriented Pain management: satisfactory to patient Vital Signs Assessment: post-procedure vital signs reviewed and stable Respiratory status: spontaneous breathing and nonlabored ventilation Cardiovascular status: stable Postop Assessment: no headache, no backache, patient able to bend at knees, no signs of nausea or vomiting and adequate PO intake Anesthetic complications: no     Last Vitals:  Vitals:   06/06/16 0144 06/06/16 0551  BP: (!) 92/52 (!) 94/56  Pulse: (!) 56 (!) 56  Resp: 18 18  Temp: 37 C 36.7 C    Last Pain:  Vitals:   06/06/16 0551  TempSrc: Oral  PainSc:    Pain Goal: Patients Stated Pain Goal: 3 (06/05/16 1750)               Madison Hickman

## 2016-06-06 NOTE — Progress Notes (Signed)
Patient ID: SHANTAVIOUS ALGER, female   DOB: 27-Jan-1981, 35 y.o.   MRN: [id number]  Subjective: POD# 1 Information for the patient's newborn:  Angely, Ponds [[id number]]  female  / circ complete  Reports feeling Well.  Feeding: breast Patient reports tolerating PO.  Breast symptoms: none Pain controlled withPO meds Denies HA/SOB/C/P/N/V/dizziness. Flatus present. She reports vaginal bleeding as normal, without clots.  She is ambulating, urinating without difficult.     Objective:   VS:  Vitals:   06/05/16 2150 06/05/16 2153 06/06/16 0144 06/06/16 0551  BP: (!) 89/54 (!) 90/57 (!) 92/52 (!) 94/56  Pulse: (!) 54 (!) 58 (!) 56 (!) 56  Resp:   18 18  Temp:   98.6 F (37 C) 98 F (36.7 C)  TempSrc:   Oral Oral  SpO2:    99%     Intake/Output Summary (Last 24 hours) at 06/06/16 1320 Last data filed at 06/06/16 1100  Gross per 24 hour  Intake             2130 ml  Output             4000 ml  Net            -1870 ml        Recent Labs  06/04/16 0948 06/06/16 0621  WBC 8.2 12.0*  HGB 11.2* 9.8*  HCT 32.8* 29.5*  PLT 299 248     Blood type: --/--/A POS, A POS (07/24 2761)  Rubella: Nonimmune (01/06 0000)     Physical Exam:  General: alert, appears stated age and no distress CV: Regular rate and rhythm Resp: clear Abdomen: soft, nontender, normal bowel sounds Incision: clean, dry, intact and honeycomb dressing Uterine Fundus: firm, below umbilicus, mildly tender Lochia: minimal Ext: extremities normal, atraumatic, no cyanosis or edema      Assessment/Plan: 35 y.o.   POD# 1. [id number]                  Principal Problem:   Postpartum care following cesarean delivery (7/25) Active Problems:   Previous cesarean delivery affecting pregnancy   Anemia in pregnancy  Start oral Fe supplement at DC Doing well, stable.               Advance diet as tolerated Ambulate Routine post-op care Anticipate DC in AM  Neta Mends, CNM 06/06/2016, 1:20 PM

## 2016-06-06 NOTE — Progress Notes (Signed)
Notified Marlinda Mike CNM about pain management orders. Told to Discontinue Dilaudid because it was a PACU order and to make scheduled Tylenol PRN.

## 2016-06-06 NOTE — Addendum Note (Signed)
Addendum  created 06/06/16 0743 by Shanon Payor, CRNA   Sign clinical note

## 2016-06-06 NOTE — Lactation Note (Signed)
This note was copied from a baby's chart. Lactation Consultation Note Experienced BF mother reports that BF is going well but that baby is not latching to the right breast.  Offered assistance with latching but mom declined stating she would work on it when she got home.  Patient will call for lactation assistance if needed.  Patient Name: Ariana Brown [id number] Date: 06/06/2016     Maternal Data    Feeding    LATCH Score/Interventions                      Lactation Tools Discussed/Used     Consult Status      Soyla Dryer 06/06/2016, 3:58 PM

## 2016-06-07 MED ORDER — SENNOSIDES-DOCUSATE SODIUM 8.6-50 MG PO TABS: 2.0000 | ORAL_TABLET | ORAL | 0 refills | Status: AC

## 2016-06-07 MED ORDER — IBUPROFEN 600 MG PO TABS: 600.0000 mg | ORAL_TABLET | Freq: Four times a day (QID) | ORAL | 0 refills | Status: AC | PRN

## 2016-06-07 MED ORDER — OXYCODONE-ACETAMINOPHEN 5-325 MG PO TABS: ORAL_TABLET | ORAL | 0 refills | Status: AC

## 2016-06-07 NOTE — Discharge Summary (Signed)
OB Discharge Summary  Patient Name: Ariana Brown DOB: 1981/05/05 MRN: [id number]  Date of admission: 06/05/2016  Pt is a G2P2002 at [redacted]w[redacted]d. Admitting diagnosis: Previous Cesarean Section Secondary diagnosis: None   Discharge diagnosis: Elective rpt C/S at 39 weeks   Date of discharge: 06/07/2016        Prenatal history: G2P2002   EDC : 06/12/2016, by Other Basis  Prenatal care at [facility]  Primary provider : Dr Billy Coast Prenatal course uncomplicated  Prenatal Labs: ABO, Rh: --/--/A POS, A POS (07/24 0948) Antibody: NEG (07/24 0948) Rubella: Nonimmune (01/06 0000) RPR: Non Reactive (07/24 0948)  HBsAg: Negative (01/06 0000)  HIV: Non-reactive (01/06 0000)  GBS: Positive (06/27 0000)                                    Hospital course:  Sceduled C/S   35 y.o. yo G2P2002 at [redacted]w[redacted]d was admitted to the hospital 06/05/2016 for scheduled cesarean section with the following indication:Elective Repeat.  Membrane Rupture Time/Date: 11:51 AM ,06/05/2016   Patient delivered a Viable infant.06/05/2016  Details of operation can be found in separate operative note.  Pateint had an uncomplicated postpartum course.  She is ambulating, tolerating a regular diet, passing flatus, and urinating well. Patient is discharged home in stable condition on  06/07/16         Augmentation: none Delivering PROVIDER: Olivia Mackie                                                            Complications: None  Newborn Data: Live born female  Birth Weight: 8 lb 6.8 oz (3820 g) APGAR: 8, 9  Baby Feeding: Breast Disposition:home with mother  Post partum procedures:none    Labs: Lab Results  Component Value Date   WBC 12.0 (H) 06/06/2016   HGB 9.8 (L) 06/06/2016   HCT 29.5 (L) 06/06/2016   MCV 86.0 06/06/2016   PLT 248 06/06/2016     Physical Exam @ time of discharge:  Vitals:   06/06/16 0144 06/06/16 0551 06/06/16 1858 06/07/16 0630  BP: (!) 92/52 (!) 94/56 106/60  (!) 99/56  Pulse: (!) 56 (!) 56 64 62  Resp: 18 18 18 18   Temp: 98.6 F (37 C) 98 F (36.7 C) 98.2 F (36.8 C) 97.6 F (36.4 C)  TempSrc: Oral Oral Oral Oral  SpO2:  99%      General: alert, cooperative and no distress Lochia: appropriate Uterine Fundus: firm Incision: Covered with Tegaderm and honeycomb dressing; well approximated. Extremities: No evidence of DVT seen on physical exam. No significant calf/ankle edema.   No flowsheet data found. Discharge Medications:    Medication List    TAKE these medications   acetaminophen 325 MG tablet Commonly known as:  TYLENOL Take 650 mg by mouth every 6 (six) hours as needed for mild pain or moderate pain.   DELSYM NIGHT TIME MULTI-SYMPT 15-6.25-325 MG/15ML Liqd Generic drug:  DM-Doxylamine-Acetaminophen Take 10 mLs by mouth every 12 (twelve) hours as needed (congestion).   ibuprofen 600 MG tablet Commonly known as:  ADVIL,MOTRIN Take 1 tablet (600 mg total) by mouth every 6 (six) hours as needed (pain).  oxyCODONE-acetaminophen 5-325 MG tablet Commonly known as:  PERCOCET/ROXICET 1 to 2 tablets every 4 hrs prn pain   prenatal multivitamin Tabs tablet Take 1 tablet by mouth daily at 12 noon.   senna-docusate 8.6-50 MG tablet Commonly known as:  Senokot-S Take 2 tablets by mouth daily.        Discharge instructions:  "Baby and Me Booklet" and [employer] Booklet  Diet: routine diet  Activity: Advance as tolerated. Pelvic rest x 6 weeks.   Follow up:6 weeks    Demetrius Revel, MSN, [facility] 06/07/2016, 12:02 PM

## 2016-06-07 NOTE — Progress Notes (Signed)
Patient ID: Ariana Brown, female   DOB: 1981/08/07, 35 y.o.   MRN: [id number] POD # 2  Subjective: Pt reports feeling well and desires early d/c home / Pain controlled with ibuprofen and percocet Tolerating po/Voiding without problems/ No n/v/Flatus pos Activity: out of bed and ambulate Bleeding is light Newborn info:  Information for the patient's newborn:  Ariana, Brown [[id number]]  female  / circ performed by Dr Billy Coast / Feeding: breast   Objective: VS: Blood pressure (!) 99/56, pulse 62, temperature 97.6 F (36.4 C), temperature source Oral, resp. rate 18, SpO2 99 %, unknown if currently breastfeeding.   LABS:  Recent Labs  06/06/16 0621  WBC 12.0*  HGB 9.8*  PLT 248                             Physical Exam:  General: alert, cooperative and no distress CV: Regular rate and rhythm Resp: clear Abdomen: soft, nontender, normal bowel sounds Incision: Covered with Tegaderm and honeycomb dressing; well approximated. Uterine Fundus: firm, below umbilicus, nontender Lochia: minimal Ext: edema trace and Homans sign is negative, no sign of DVT    A/P: POD # 2  G2P2002/  S/P Rpt C/S @ 39wks Doing well and stable for discharge home RX's: Ibuprofen 600mg  po Q 6 hrs prn pain #30 Refill x 1 Percocet 5/325 1 - 2 tabs po every 6 hrs prn pain  #30 No refill Niferex 150mg  po QD/BID #30/#60 Refill x 1 Colace 100mg  po up to TID prn #30 Ref x 1    Signed: Demetrius Revel, MSN, [facility] 06/07/2016, 10:06 AM

## 2016-06-07 NOTE — Lactation Note (Signed)
This note was copied from a baby's chart. Lactation Consultation Note Experienced BF mom states BF going well at this time. Baby is BF for long periods. Baby had 6% weight loss, has cluster fed. Baby had 5 voids and 5 stools in 36 hrs. WDL. Mom plans to be d/c home today. States has no further questions or needs for lactation. I hate completed consult. Encouraged to call if needed, reviewed OP services. Discussed mature milk, & engorgement. Patient Name: Ariana Brown'X Date: 06/07/2016 Reason for consult: Follow-up assessment;Infant weight loss   Maternal Data    Feeding Feeding Type: Breast Fed Length of feed: 20 min (still BF)  LATCH Score/Interventions Latch: Grasps breast easily, tongue down, lips flanged, rhythmical sucking. Intervention(s): Skin to skin;Teach feeding cues;Waking techniques Intervention(s): Adjust position;Assist with latch;Breast massage;Breast compression  Audible Swallowing: Spontaneous and intermittent  Type of Nipple: Everted at rest and after stimulation  Comfort (Breast/Nipple): Soft / non-tender     Hold (Positioning): No assistance needed to correctly position infant at breast.  LATCH Score: 10  Lactation Tools Discussed/Used     Consult Status Consult Status: Complete Date: 06/07/16    Charyl Dancer 06/07/2016, 4:51 AM

## 2017-08-15 DIAGNOSIS — Z23 Encounter for immunization: Secondary | ICD-10-CM | POA: Diagnosis not present

## 2017-10-09 DIAGNOSIS — L720 Epidermal cyst: Secondary | ICD-10-CM | POA: Diagnosis not present

## 2017-10-09 DIAGNOSIS — D2371 Other benign neoplasm of skin of right lower limb, including hip: Secondary | ICD-10-CM | POA: Diagnosis not present

## 2018-08-21 DIAGNOSIS — Z23 Encounter for immunization: Secondary | ICD-10-CM | POA: Diagnosis not present

## 2018-11-28 DIAGNOSIS — Z118 Encounter for screening for other infectious and parasitic diseases: Secondary | ICD-10-CM | POA: Diagnosis not present

## 2018-11-28 DIAGNOSIS — Z113 Encounter for screening for infections with a predominantly sexual mode of transmission: Secondary | ICD-10-CM | POA: Diagnosis not present

## 2018-11-28 DIAGNOSIS — Z124 Encounter for screening for malignant neoplasm of cervix: Secondary | ICD-10-CM | POA: Diagnosis not present

## 2018-11-28 DIAGNOSIS — Z01419 Encounter for gynecological examination (general) (routine) without abnormal findings: Secondary | ICD-10-CM | POA: Diagnosis not present

## 2018-11-28 DIAGNOSIS — Z1151 Encounter for screening for human papillomavirus (HPV): Secondary | ICD-10-CM | POA: Diagnosis not present

## 2018-11-28 DIAGNOSIS — Z6827 Body mass index (BMI) 27.0-27.9, adult: Secondary | ICD-10-CM | POA: Diagnosis not present

## 2019-06-11 DIAGNOSIS — D2271 Melanocytic nevi of right lower limb, including hip: Secondary | ICD-10-CM | POA: Diagnosis not present

## 2019-06-11 DIAGNOSIS — D2262 Melanocytic nevi of left upper limb, including shoulder: Secondary | ICD-10-CM | POA: Diagnosis not present

## 2019-06-11 DIAGNOSIS — D2272 Melanocytic nevi of left lower limb, including hip: Secondary | ICD-10-CM | POA: Diagnosis not present

## 2019-10-06 DIAGNOSIS — Z20828 Contact with and (suspected) exposure to other viral communicable diseases: Secondary | ICD-10-CM | POA: Diagnosis not present

## 2019-10-06 DIAGNOSIS — Z9189 Other specified personal risk factors, not elsewhere classified: Secondary | ICD-10-CM | POA: Diagnosis not present

## 2020-02-18 DIAGNOSIS — Z6824 Body mass index (BMI) 24.0-24.9, adult: Secondary | ICD-10-CM | POA: Diagnosis not present

## 2020-02-18 DIAGNOSIS — Z01419 Encounter for gynecological examination (general) (routine) without abnormal findings: Secondary | ICD-10-CM | POA: Diagnosis not present

## 2020-06-21 DIAGNOSIS — D2272 Melanocytic nevi of left lower limb, including hip: Secondary | ICD-10-CM | POA: Diagnosis not present

## 2020-06-21 DIAGNOSIS — D225 Melanocytic nevi of trunk: Secondary | ICD-10-CM | POA: Diagnosis not present

## 2020-06-21 DIAGNOSIS — D2271 Melanocytic nevi of right lower limb, including hip: Secondary | ICD-10-CM | POA: Diagnosis not present

## 2020-06-21 DIAGNOSIS — D2371 Other benign neoplasm of skin of right lower limb, including hip: Secondary | ICD-10-CM | POA: Diagnosis not present

## 2020-06-22 DIAGNOSIS — Z0000 Encounter for general adult medical examination without abnormal findings: Secondary | ICD-10-CM | POA: Diagnosis not present

## 2020-06-22 DIAGNOSIS — F419 Anxiety disorder, unspecified: Secondary | ICD-10-CM | POA: Diagnosis not present

## 2020-07-13 DIAGNOSIS — Z20822 Contact with and (suspected) exposure to covid-19: Secondary | ICD-10-CM | POA: Diagnosis not present

## 2020-07-27 DIAGNOSIS — F419 Anxiety disorder, unspecified: Secondary | ICD-10-CM | POA: Diagnosis not present

## 2020-08-02 DIAGNOSIS — Z20822 Contact with and (suspected) exposure to covid-19: Secondary | ICD-10-CM | POA: Diagnosis not present

## 2020-08-02 DIAGNOSIS — Z9189 Other specified personal risk factors, not elsewhere classified: Secondary | ICD-10-CM | POA: Diagnosis not present

## 2020-08-04 DIAGNOSIS — Z13220 Encounter for screening for lipoid disorders: Secondary | ICD-10-CM | POA: Diagnosis not present

## 2020-08-04 DIAGNOSIS — Z131 Encounter for screening for diabetes mellitus: Secondary | ICD-10-CM | POA: Diagnosis not present

## 2020-11-21 DIAGNOSIS — J029 Acute pharyngitis, unspecified: Secondary | ICD-10-CM | POA: Diagnosis not present

## 2022-02-07 ENCOUNTER — Other Ambulatory Visit: Payer: Self-pay | Admitting: Physician Assistant

## 2022-02-07 DIAGNOSIS — R1084 Generalized abdominal pain: Secondary | ICD-10-CM

## 2022-02-08 ENCOUNTER — Ambulatory Visit
Admission: RE | Admit: 2022-02-08 | Discharge: 2022-02-08 | Disposition: A | Discharge status: Home / Self Care | Payer: 59 | Source: Ambulatory Visit | Attending: Physician Assistant | Admitting: Physician Assistant

## 2022-02-08 DIAGNOSIS — R1084 Generalized abdominal pain: Secondary | ICD-10-CM

## 2022-07-12 IMAGING — US US ABDOMEN COMPLETE
1 series · 14 of 25 positions shown · non-contrast
Comparison: Abdominal ultrasound 11/18/2007.

CLINICAL DATA: Generalized abdominal pain.

EXAM:
ABDOMEN ULTRASOUND COMPLETE

[Series 1: us abdomen complete · 0.17mm/px · 14 of 72 slices shown]
[im 1/72]
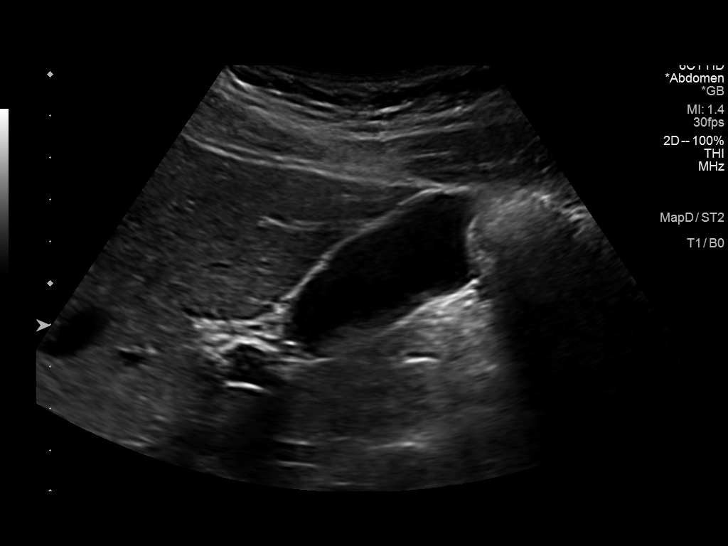
[im 6/72]
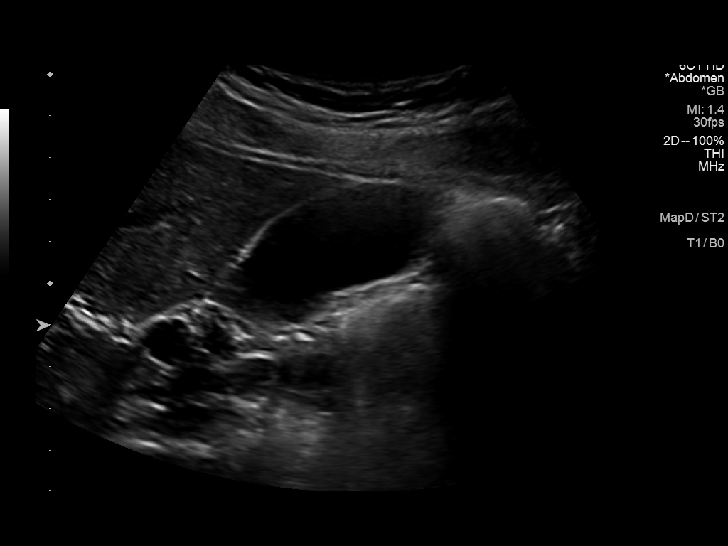
[im 12/72]
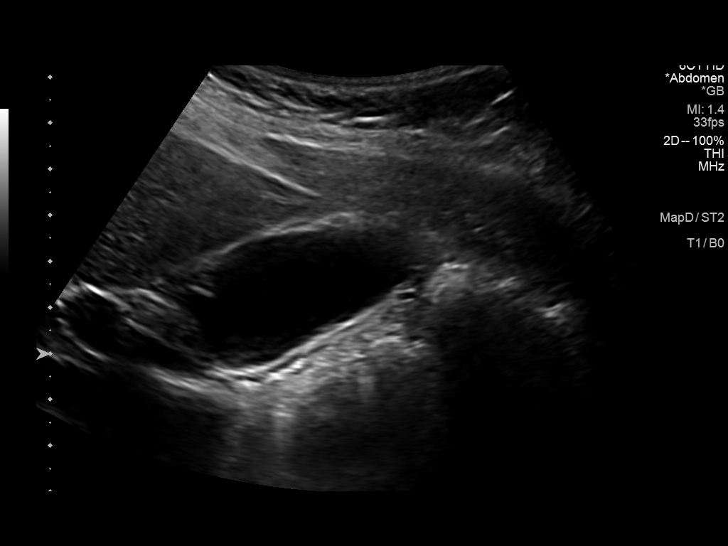
[im 18/72]
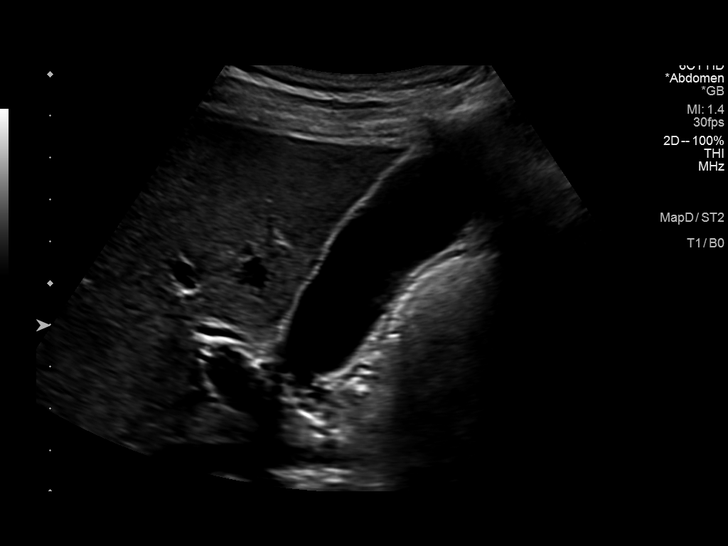
[im 24/72]
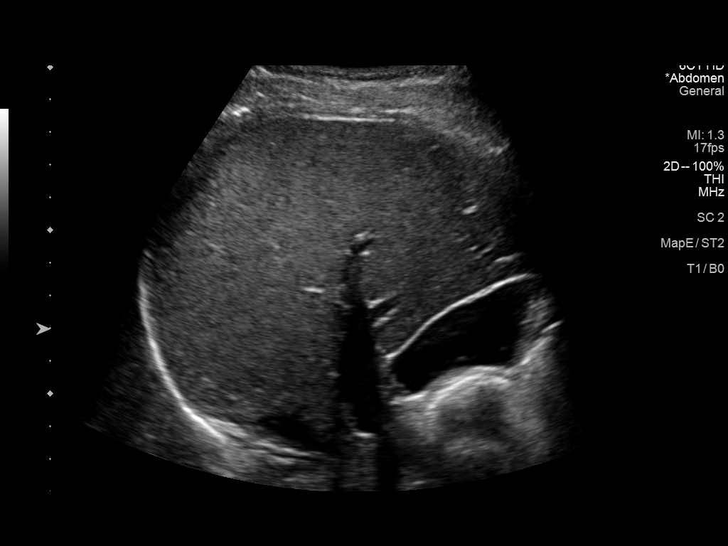
[im 27/72]
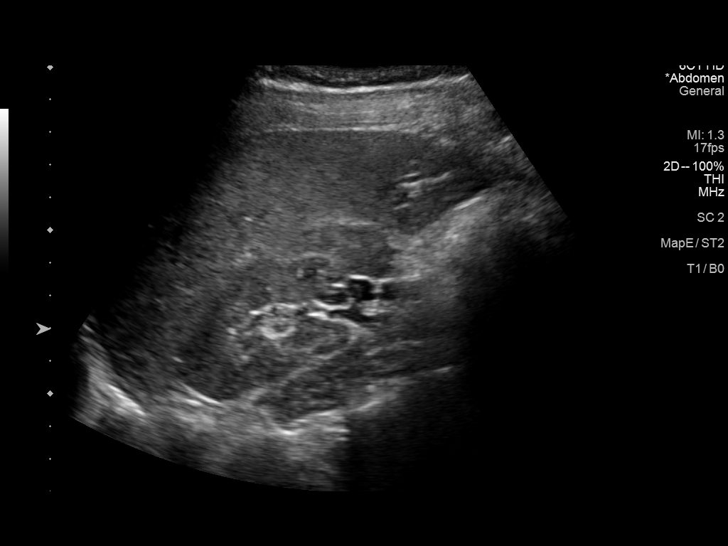
[im 33/72]
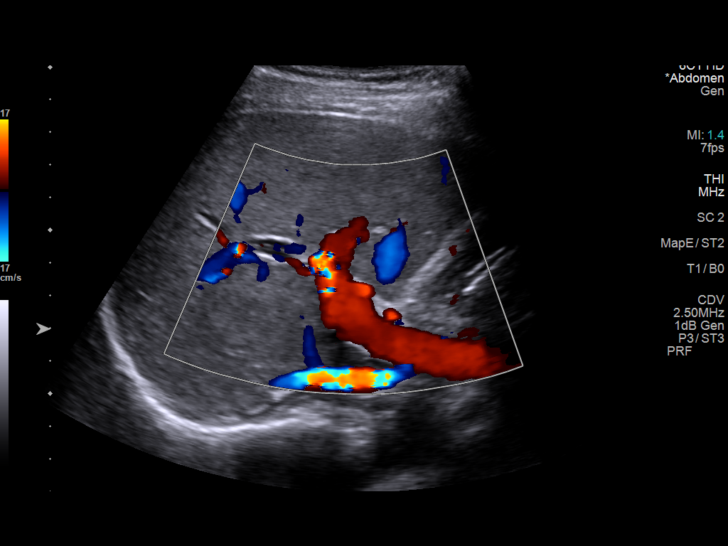
[im 39/72]
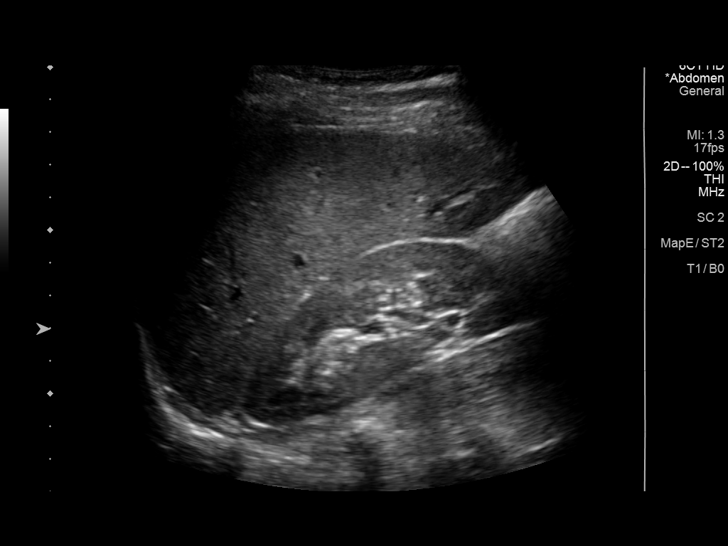
[im 45/72]
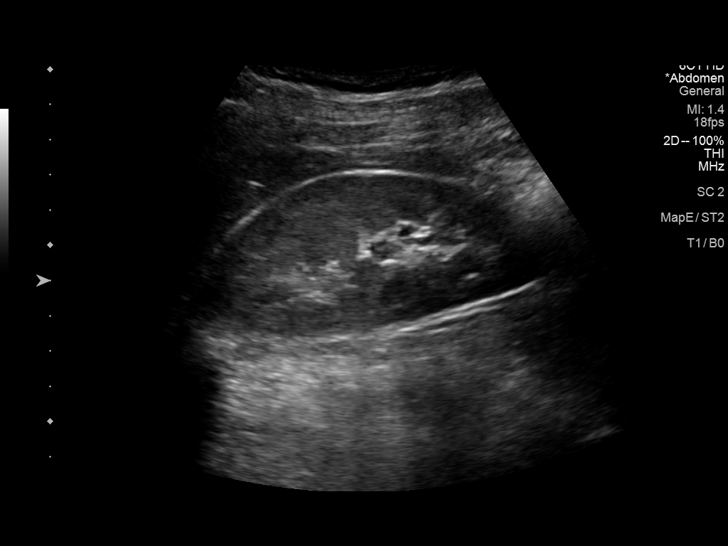
[im 48/72]
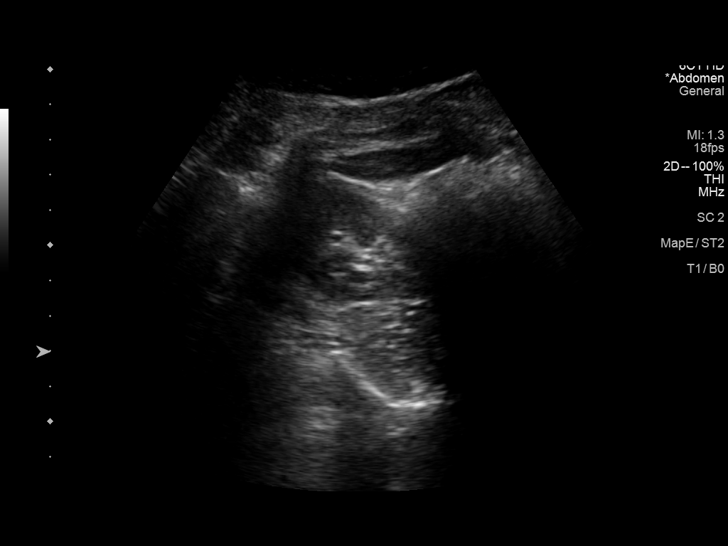
[im 54/72]
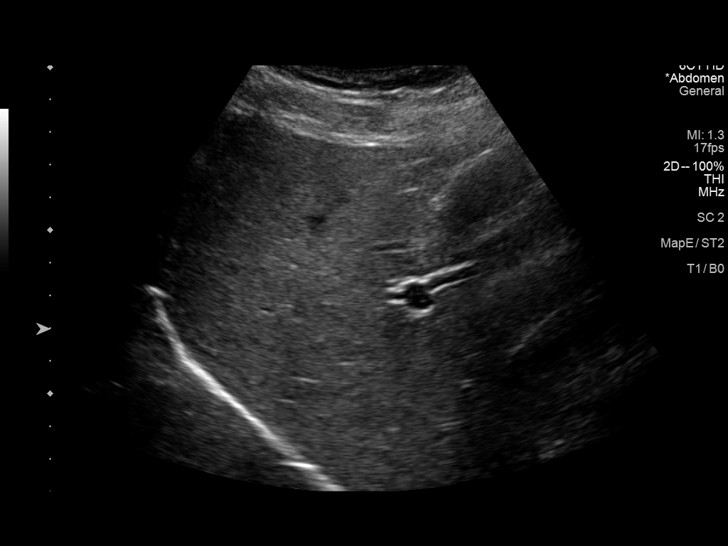
[im 60/72]
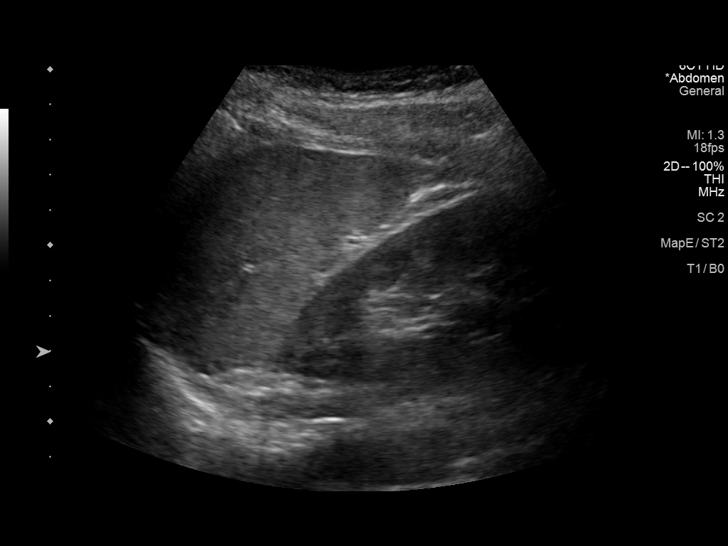
[im 66/72]
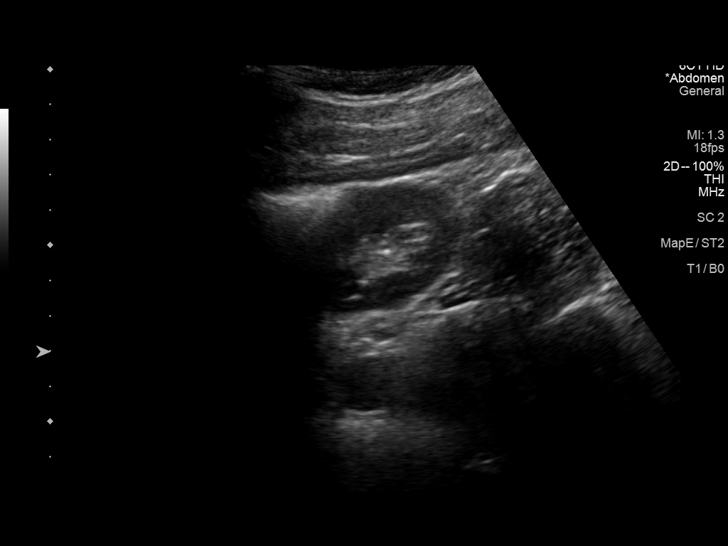
[im 72/72]
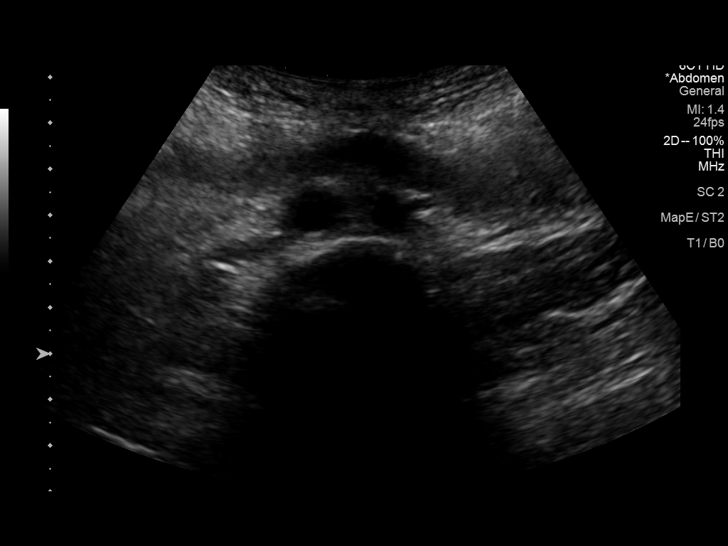

[14 of 25 positions shown; findings below may reference images not displayed]

FINDINGS: Gallbladder: No gallstones or wall thickening visualized. No
sonographic Murphy sign noted by sonographer.

Common bile duct: Diameter: 2-3 mm, within normal limits.

Liver: No focal lesion identified. Within normal limits in
parenchymal echogenicity. Portal vein is patent on color Doppler
imaging with normal direction of blood flow towards the liver.

IVC: Obscured by overlying bowel gas.

Pancreas: Obscured by overlying bowel gas.

Spleen: Size and appearance within normal limits.

Right Kidney: Length: 11.3 cm. Echogenicity within normal limits. No
mass or hydronephrosis visualized.

Left Kidney: Length: 11.3 cm. Echogenicity within normal limits. No
mass or hydronephrosis visualized.

Abdominal aorta: The proximal aorta is obscured by overlying bowel
gas. No aneurysm visualized.
IMPRESSION: Overlying bowel gas obscures the pancreas, IVC and proximal aorta.

Within this limitation, unremarkable abdominal ultrasound as
described.
